# Patient Record
Sex: Female | Born: 1955
Health system: Southern US, Community
[De-identification: ages and names within clinical notes are randomized; demographics above are authoritative.]

## PROBLEM LIST (undated history)

## (undated) DIAGNOSIS — R011 Cardiac murmur, unspecified: Secondary | ICD-10-CM

## (undated) DIAGNOSIS — H269 Unspecified cataract: Secondary | ICD-10-CM

## (undated) DIAGNOSIS — H409 Unspecified glaucoma: Secondary | ICD-10-CM

## (undated) DIAGNOSIS — R7303 Prediabetes: Secondary | ICD-10-CM

## (undated) DIAGNOSIS — E669 Obesity, unspecified: Secondary | ICD-10-CM

## (undated) DIAGNOSIS — E781 Pure hyperglyceridemia: Secondary | ICD-10-CM

## (undated) HISTORY — DX: Obesity, unspecified: E66.9

## (undated) HISTORY — DX: Cardiac murmur, unspecified: R01.1

## (undated) HISTORY — DX: Unspecified glaucoma: H40.9

## (undated) HISTORY — DX: Unspecified cataract: H26.9

## (undated) HISTORY — DX: Pure hyperglyceridemia: E78.1

## (undated) HISTORY — DX: Prediabetes: R73.03

## (undated) HISTORY — PX: GLAUCOMA SURGERY: SHX656

---

## 2012-06-30 ENCOUNTER — Ambulatory Visit (INDEPENDENT_AMBULATORY_CARE_PROVIDER_SITE_OTHER): Payer: BC Managed Care – PPO | Admitting: Family Medicine

## 2012-06-30 ENCOUNTER — Encounter: Payer: Self-pay | Admitting: Family Medicine

## 2012-06-30 VITALS — BP 124/80 | HR 50 | Ht 65.0 in | Wt 162.0 lb

## 2012-06-30 DIAGNOSIS — R7309 Other abnormal glucose: Secondary | ICD-10-CM

## 2012-06-30 DIAGNOSIS — R7303 Prediabetes: Secondary | ICD-10-CM

## 2012-06-30 DIAGNOSIS — Z0000 Encounter for general adult medical examination without abnormal findings: Secondary | ICD-10-CM

## 2012-06-30 HISTORY — DX: Prediabetes: R73.03

## 2012-06-30 NOTE — Patient Instructions (Signed)
Diabetes y Doroteo Glassman fsica (Diabetes and Exercise) La actividad fsica regular es muy importante y [place] a:   Chief Operating Officer el nivel de glucosa en sangre (azcar).   Disminuir la presin arterial.   Controlar el colesterol en sangre (colesterol y triglicridos).   Mejorar el estado de salud general.  BENEFICIOS DE LA ACTIVIDAD FSICA  Mejora el buen estado fsico.   Mejora la flexibilidad.   Aumenta la resistencia.   Aumenta la densidad sea.   Favorece el control del peso.   Aumenta la fuerza muscular.   Disminuye la Art gallery manager.   Mejora la utilizacin de la insulina por parte del organismo.   Aumenta la sensibilidad a la insulina.   Reduce las necesidades de insulina.   Har que se sienta mejor.   Reduce el estrs y las tensiones.  Las personas diabticas que incorporan la actividad fsica a su estilo de vida obtienen beneficios adicionales.  Prdida de peso.   Reduccin del apetito.   Mejora la utilizacin de la glucosa por parte del organismo.   Disminuye los factores de riesgo para las enfermedades cardacas.   Disminuye el colesterol y los triglicridos.   Eleva el nivel de colesterol bueno (lipoproteinas de alta densidad HDL).   Disminuye el nivel de Banker.   Disminuye la presin arterial.  DIABETES TIPO I Y ACTIVIDAD FSICA  La actividad fsica disminuir el nivel de glucosa en [place].   Si el nivel de glucosa en sangre es de ms de 240 mg/dl, controle las cetonas en la [place]. Si hay cetonas, no realice actividad fisica.   El sitio de inyeccin de la insulina puede requerir un ajuste cuando se realiza actividad fsica. Evite inyectarse insulina en las zonas del cuerpo que ejercitar. Por ejemplo, evite inyectarse insulina en:   Los brazos, si juega al tenis.   Las piernas, si corre. Para obtener ms informacion, consulte a su mdico.   Lleve un registro de:   La ingesta de alimentos.   El tipo y cantidad de [place].     Los momentos esperables de picos de accin de la insulina.   Los niveles de Event organiser.  Hgalo antes, durante y despus de Printmaker actividad fsica. Verifique los registros junto con su mdico. Esto ser de utilidad para la confeccin de pautas para ajustar la ingesta de alimentos o las cantidades de [place].  DIABETES TIPO 2 Y ACTIVIDAD FSICA  La actividad fsica regular ayuda a controlar el nivel de glucosa en [place].   La actividad fsica es importante porque:   Aumenta la sensibilidad del organismo a la insulina.   Mejora el control del nivel de Event organiser.   Reduce el riesgo de enfermedades cardiacas. Disminuye el colesterol srico y los triglicridos. Disminuye la presin arterial.   Las personas que reciben insulina o agentes hipoglucemiantes por va oral deben controlar la aparicin de signos de hipoglucemia (mareos, temblores, sudoracin, escalofros y confusin).   Durante la actividad fsica se pierde Data processing manager. Esta prdida de lquidos debe reponerse. De este modo se evita la prdida de lquidos corporales (deshidratacin) y el golpe de Airline pilot.  Comente con su mdico antes de comenzar un programa de actividad fsica para verificar que sea seguro para usted. Recuerde, cualquier actividad es mejor que ninguna.  Document Released: 11/02/2007 Document Revised: 10/02/2011 [facility] Patient Information 2012 [place], [place].

## 2012-06-30 NOTE — Progress Notes (Signed)
CC: Caroline Silva is a 56 y.o. female is here for Establish Care   Subjective: HPI:  Patient is a very pleasant 56 year old who states primarily Spanish however can converse easily in [place] and is here to establish care. She requests a complete physical exam but due to time constraints she would like to discuss the following death  She was told at a health fair that her sugar was elevated she believes that her value was 94 she's unsure whether or not she was fasting or not. 2 of her brothers have type 2 diabetes but there are no other chronic medical conditions that run in her family. She denies polyphagia polydipsia polyuria, poorly healing wounds, foot complaints, motor sensory disturbances, chest pain, nor shortness of breath. She takes no medications on a daily basis a multivitamin. She has no past surgical history in states she has no past medical history. She works as a Press photographer on a Restaurant manager, fast food Outlined In HPI  History reviewed. No pertinent past medical history.   History reviewed. No pertinent family history.   History  Substance Use Topics  . Smoking status: Former Games developer  . Smokeless tobacco: Not on file  . Alcohol Use: No     Objective: Filed Vitals:   06/30/12 1543  BP: 124/80  Pulse: 50    General: Alert and Oriented, No Acute Distress HEENT: Pupils equal, round, reactive to light. Conjunctivae clear.  External ears unremarkable, canals clear with intact TMs with appropriate landmarks.  Middle ear appears open without effusion. Pink inferior turbinates.  Moist mucous membranes, pharynx without inflammation nor lesions.  Neck supple without palpable lymphadenopathy nor abnormal masses. Lungs: Clear to auscultation bilaterally, no wheezing/ronchi/rales.  Comfortable work of breathing. Good air movement. Cardiac: Regular rate and rhythm. Normal S1/S2.  No murmurs, rubs, nor gallops.  No carotid bruits Extremities: No peripheral  edema.  Strong peripheral pulses. Bilateral feet without calluses or skin breakdown. Mental Status: No depression, anxiety, nor agitation. Skin: Warm and dry.  Assessment & Plan: Caroline Silva was seen today for establish care.  Diagnoses and associated orders for this visit:  Abnormal glucose - POCT HgB A1C  Routine health maintenance - MM Digital Screening; Future - Lipid panel  Prediabetes  Her hemoglobin A1c of 6.0 put her in a prediabetic range, this diagnosis was discussed with her and handout in Spanish was given to her outlining dietary and physical activity interventions to help minimize her risk of developing diabetes. In 3 months we'll check another A1c. She states it has been years since her cholesterol checked and she's fasting right now so we are lipid panel. She sits for more than a year since she's had a mammogram therefore a referral was placed today. She tells me she has no history of abnormal Pap smears her most recent one was 3 years ago, she would like to wait until the five-year mark before having another. I've asked her to return for complete physical exam or in 3 months at the latest for followup of her sugar levels he'll call her with her cholesterol results tomorrow.    Return in about 3 months (around 09/29/2012).  Requested Prescriptions    No prescriptions requested or ordered in this encounter

## 2012-07-01 LAB — LIPID PANEL
HDL: 45 mg/dL (ref >39)
LDL Cholesterol: 123 mg/dL — ABNORMAL HIGH (ref 0–99)
Total CHOL/HDL Ratio: 4.3 Ratio
Triglycerides: 129 mg/dL (ref <150)
VLDL: 26 mg/dL (ref 0–40)

## 2012-07-06 ENCOUNTER — Ambulatory Visit (INDEPENDENT_AMBULATORY_CARE_PROVIDER_SITE_OTHER): Payer: BC Managed Care – PPO

## 2012-07-06 DIAGNOSIS — Z1231 Encounter for screening mammogram for malignant neoplasm of breast: Secondary | ICD-10-CM

## 2012-07-06 DIAGNOSIS — Z0000 Encounter for general adult medical examination without abnormal findings: Secondary | ICD-10-CM

## 2013-09-20 ENCOUNTER — Ambulatory Visit: Payer: BC Managed Care – PPO | Admitting: Family Medicine

## 2014-01-20 ENCOUNTER — Ambulatory Visit (INDEPENDENT_AMBULATORY_CARE_PROVIDER_SITE_OTHER): Payer: BC Managed Care – PPO | Admitting: Family Medicine

## 2014-01-20 ENCOUNTER — Other Ambulatory Visit (HOSPITAL_COMMUNITY)
Admission: RE | Admit: 2014-01-20 | Discharge: 2014-01-20 | Disposition: A | Discharge status: Home / Self Care | Payer: BC Managed Care – PPO | Source: Ambulatory Visit | Attending: Family Medicine | Admitting: Family Medicine

## 2014-01-20 ENCOUNTER — Encounter: Payer: Self-pay | Admitting: Family Medicine

## 2014-01-20 VITALS — BP 110/67 | HR 54 | Ht 65.0 in | Wt 174.0 lb

## 2014-01-20 DIAGNOSIS — Z124 Encounter for screening for malignant neoplasm of cervix: Secondary | ICD-10-CM

## 2014-01-20 DIAGNOSIS — R87810 Cervical high risk human papillomavirus (HPV) DNA test positive: Secondary | ICD-10-CM | POA: Insufficient documentation

## 2014-01-20 DIAGNOSIS — Z1239 Encounter for other screening for malignant neoplasm of breast: Secondary | ICD-10-CM

## 2014-01-20 DIAGNOSIS — R7303 Prediabetes: Secondary | ICD-10-CM

## 2014-01-20 DIAGNOSIS — R7309 Other abnormal glucose: Secondary | ICD-10-CM

## 2014-01-20 DIAGNOSIS — Z1151 Encounter for screening for human papillomavirus (HPV): Secondary | ICD-10-CM | POA: Insufficient documentation

## 2014-01-20 NOTE — Progress Notes (Signed)
CC: Caroline Silva is a 58 y.o. female is here for pap only and mammogram   Subjective: HPI:  No acute complaints, she recently had her blood sugar checked at work during a physical exam and had prediabetic range of blood sugar. She is uncertain if this was an A1c or fasting blood sugar. Denies polyuria polyphasia or polydipsia  She is requesting a Pap smear the last one was over 5 years ago without any abnormals. She denies any vaginal bleeding since menopause estimated at 10 years ago.  She's due for her annual mammogram   Review Of Systems Outlined In HPI  No past medical history on file.  No past surgical history on file. No family history on file.  History   Social History  . Marital Status: Single    Spouse Name: N/A    Number of Children: N/A  . Years of Education: N/A   Occupational History  . Not on file.   Social History Main Topics  . Smoking status: Former Research scientist (life sciences)  . Smokeless tobacco: Not on file  . Alcohol Use: No  . Drug Use: No  . Sexual Activity: Not on file   Other Topics Concern  . Not on file   Social History Narrative  . No narrative on file     Objective: BP 110/67  Pulse 54  Ht 5\' 5"  (1.651 m)  Wt 174 lb (78.926 kg)  BMI 28.96 kg/m2   Pulmonary: Clear to auscultation bilaterally without wheezing, rhonchi, nor rales. Cardiac: Regular rate and rhythm.  No murmurs, rubs, nor gallops. No peripheral edema.  2+ peripheral pulses bilaterally. Abdomen: Bowel sounds normal.  No masses.  Non-tender without rebound.  Negative Murphy's sign. GU: Labia majora & minora without lesions.  Distal urethra unremarkable.  Vaginal wall integrity preserved without mucosal lesions.  Cervix non-tender and without gross lesions nor discharge.     Assessment & Plan: Caroline Silva was seen today for pap only and mammogram.  Diagnoses and associated orders for this visit:  Screening for cervical cancer - Cytology - PAP  Screening for breast cancer - MM DIGITAL  SCREENING BILATERAL; Future  Prediabetes    Pap smear was obtained without difficulty will notify of results once available Mammogram has been ordered Prediabetes appears to be stable with her report of blood sugar at work   Return in about 3 months (around 04/22/2014).

## 2014-01-24 ENCOUNTER — Ambulatory Visit (INDEPENDENT_AMBULATORY_CARE_PROVIDER_SITE_OTHER): Payer: BC Managed Care – PPO

## 2014-01-24 DIAGNOSIS — Z1231 Encounter for screening mammogram for malignant neoplasm of breast: Secondary | ICD-10-CM

## 2014-01-24 DIAGNOSIS — Z1239 Encounter for other screening for malignant neoplasm of breast: Secondary | ICD-10-CM

## 2014-01-26 ENCOUNTER — Encounter: Payer: Self-pay | Admitting: Family Medicine

## 2014-01-26 DIAGNOSIS — IMO0002 Reserved for concepts with insufficient information to code with codable children: Secondary | ICD-10-CM | POA: Insufficient documentation

## 2014-07-20 ENCOUNTER — Ambulatory Visit (INDEPENDENT_AMBULATORY_CARE_PROVIDER_SITE_OTHER): Payer: BC Managed Care – PPO | Admitting: Family Medicine

## 2014-07-20 ENCOUNTER — Encounter: Payer: Self-pay | Admitting: Family Medicine

## 2014-07-20 VITALS — BP 107/68 | HR 48 | Wt 184.0 lb

## 2014-07-20 DIAGNOSIS — M25519 Pain in unspecified shoulder: Secondary | ICD-10-CM

## 2014-07-20 DIAGNOSIS — B351 Tinea unguium: Secondary | ICD-10-CM

## 2014-07-20 DIAGNOSIS — M25512 Pain in left shoulder: Secondary | ICD-10-CM

## 2014-07-20 MED ORDER — TERBINAFINE HCL 250 MG PO TABS
250.0000 mg | ORAL_TABLET | Freq: Every day | ORAL | Status: DC
Start: 2014-07-20 — End: 2015-06-29

## 2014-07-20 NOTE — Progress Notes (Signed)
CC: Caroline Silva is a 58 y.o. female is here for left shoulder pain   Subjective: HPI:  Complains of left shoulder pain localized in the lateral aspect of the shoulder which is nonradiating. It is worse when lying on the left shoulder or with any abduction of the arm greater than about 75. Symptoms have been present for about 2-3 months but significantly worsened over the last 4 weeks. 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 No interventions as of yet. Denies weakness or any motor or sensory disturbances in the left upper extremity. Denies any recent or trauma and denies neck pain. There's been no overlying skin changes or swelling at the site of discomfort  Complains of thickening of the left great toenail that has been present for the past year. It is becoming discolored and painful to touch. Symptoms have been present on a daily basis and nothing particularly makes better or worse no interventions as of yet. Denies skin changes elsewhere   Review Of Systems Outlined In HPI  Past Medical History  Diagnosis Date  . Prediabetes 06/30/2012    No past surgical history on file. No family history on file.  History   Social History  . Marital Status: Single    Spouse Name: N/A    Number of Children: N/A  . Years of Education: N/A   Occupational History  . Not on file.   Social History Main Topics  . Smoking status: Former Research scientist (life sciences)  . Smokeless tobacco: Not on file  . Alcohol Use: No  . Drug Use: No  . Sexual Activity: Not on file   Other Topics Concern  . Not on file   Social History Narrative  . No narrative on file     Objective: BP 107/68  Pulse 48  Wt 184 lb (83.462 kg)  General: Alert and Oriented, No Acute Distress HEENT: Pupils equal, round, reactive to light. Conjunctivae clear.  Moist membranes Lungs: Clear to auscultation bilaterally, no wheezing/ronchi/rales.  Comfortable work of breathing. Good air movement. Cardiac: Regular rate and rhythm. Normal S1/S2.   No murmurs, rubs, nor gallops.   Left shoulder exam reveals full range of motion and strength in all planes of motion and with individual rotator cuff testing. No overlying redness warmth or swelling.  Neer's test positive.  Hawkins test negative. Empty can negative. Crossarm test positive. O'Brien's test negative. Apprehension test negative. Speed's test negative. Extremities: No peripheral edema.  Strong peripheral pulses.  Mental Status: No depression, anxiety, nor agitation. Skin: Warm and dry. Moderate onychomycosis on the majority of the left foot toenails  Assessment & Plan: Caroline Silva was seen today for left shoulder pain.  Diagnoses and associated orders for this visit:  Onychomycosis - terbinafine (LAMISIL) 250 MG tablet; Take 1 tablet (250 mg total) by mouth daily.  Left shoulder pain    onychomycosis: Start terbinafine, discussed that we'll take 1 or 2 months before she starts to notice any improvement and that the disease female will need to grow out until it is able to be cut Left shoulder pain: Suspicious of supraspinatus tendinitis therefore she was offered a regimen of outpatient anti-inflammatories or a subacromial injection and she would prefer the injection today  Subacromial Shoulder Injection Procedure Note  Pre-operative Diagnosis: left RTC tendintis  Post-operative Diagnosis: same  Indications: pain  Anesthesia: topical cold spray  Procedure Details   Verbal consent was obtained for the procedure. The shoulder was prepped with alcohol skin was anesthetized. A 27 gauge needle was  advanced into the subacromial space through posterior approach without difficulty  The space was then injected with 2 ml 1% lidocaine and 2 ml of triamcinolone (KENALOG) 40mg /ml. The injection site was cleansed with isopropyl alcohol and a dressing was applied.  Complications:  None; patient tolerated the procedure well.   Return if symptoms worsen or fail to improve.

## 2015-06-29 ENCOUNTER — Encounter: Payer: Self-pay | Admitting: Family Medicine

## 2015-06-29 ENCOUNTER — Ambulatory Visit (INDEPENDENT_AMBULATORY_CARE_PROVIDER_SITE_OTHER): Payer: BLUE CROSS/BLUE SHIELD | Admitting: Family Medicine

## 2015-06-29 VITALS — BP 108/66 | HR 61 | Wt 187.0 lb

## 2015-06-29 DIAGNOSIS — M25512 Pain in left shoulder: Secondary | ICD-10-CM | POA: Diagnosis not present

## 2015-06-29 NOTE — Progress Notes (Signed)
CC: Caroline Silva is a 59 y.o. female is here for left arm pain   Subjective: HPI:  Left shoulder pain: Describes an achiness and pain mild to moderate in severity localized deep in the left shoulder. It radiates slightly down the lateral side of the proximal arm. It goes no further than the elbow. It's worse with any abduction or reaching above her head. It's identical to the pain that she experienced last year around this time. 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 She denies any swelling redness or warmth of the shoulder. She denies any recent remote trauma. She denies joint pain elsewhere. She denies any weakness motor or sensory disturbances in the left upper extremity. She denies neck pain.   Review Of Systems Outlined In HPI  Past Medical History  Diagnosis Date  . Prediabetes 06/30/2012    No past surgical history on file. No family history on file.  Social History   Social History  . Marital Status: Single    Spouse Name: N/A  . Number of Children: N/A  . Years of Education: N/A   Occupational History  . Not on file.   Social History Main Topics  . Smoking status: Former Research scientist (life sciences)  . Smokeless tobacco: Not on file  . Alcohol Use: No  . Drug Use: No  . Sexual Activity: Not on file   Other Topics Concern  . Not on file   Social History Narrative     Objective: BP 108/66 mmHg  Pulse 61  Wt 187 lb (84.823 kg)  Vital signs reviewed. General: Alert and Oriented, No Acute Distress HEENT: Pupils equal, round, reactive to light. Conjunctivae clear.  External ears unremarkable.  Moist mucous membranes. Lungs: Clear and comfortable work of breathing, speaking in full sentences without accessory muscle use. Cardiac: Regular rate and rhythm.  Neuro: CN II-XII grossly intact, gait normal. Right shoulder exam reveals full range of motion and strength in  all planes of motion and with individual rotator cuff testing. No overlying redness warmth or swelling.  Neer's test negative.  Hawkins test positive. Empty can positive. Crossarm test negative. O'Brien's test negative. Apprehension test negative. Speed's test negative. Extremities: No peripheral edema.  Strong peripheral pulses.  Mental Status: No depression, anxiety, nor agitation. Logical though process. Skin: Warm and dry.  Assessment & Plan: Nakeitha was seen today for left arm pain.  Diagnoses and all orders for this visit:  Left shoulder pain   Left shoulder pain still sounds like bursitis or rotator cuff tendinitis, I've offered her oral medication or a repeat injection similar to last year. She would prefer to have the injection done given how well it worked last year.  Subacromial Shoulder Injection Procedure Note  Pre-operative Diagnosis: left shoulder pain  Post-operative Diagnosis: same  Indications: left shoulder pain  Anesthesia: topical cold spray  Procedure Details   Verbal consent was obtained for the procedure. The shoulder was prepped with alcohol and the skin was anesthetized. A 27 gauge needle was advanced into the subacromial space through posterior approach without difficulty  The space was then injected with 2 ml 1% lidocaine and 2 ml of triamcinolone (KENALOG) 40mg /ml. The injection site was cleansed with isopropyl alcohol and a dressing was applied.  Complications:  None; patient tolerated the procedure well.   Return if symptoms worsen or fail to improve.

## 2015-11-06 ENCOUNTER — Encounter: Payer: Self-pay | Admitting: Family Medicine

## 2015-11-06 ENCOUNTER — Ambulatory Visit (INDEPENDENT_AMBULATORY_CARE_PROVIDER_SITE_OTHER): Payer: BLUE CROSS/BLUE SHIELD | Admitting: Sports Medicine

## 2015-11-06 ENCOUNTER — Ambulatory Visit (INDEPENDENT_AMBULATORY_CARE_PROVIDER_SITE_OTHER): Payer: BLUE CROSS/BLUE SHIELD | Admitting: Family Medicine

## 2015-11-06 ENCOUNTER — Ambulatory Visit (INDEPENDENT_AMBULATORY_CARE_PROVIDER_SITE_OTHER): Payer: BLUE CROSS/BLUE SHIELD

## 2015-11-06 VITALS — BP 111/67 | HR 57 | Wt 181.0 lb

## 2015-11-06 DIAGNOSIS — M25512 Pain in left shoulder: Secondary | ICD-10-CM

## 2015-11-06 NOTE — Progress Notes (Signed)
   Subjective:    I'm seeing this patient as a consultation for:  Dr. Ileene Rubens  CC: Left shoulder pain  HPI: This is a pleasant 60 year old female, for several months she's had pain that she localizes over the deltoid of her left shoulder, worse with most motions but not over head motions. She had a subacromial injection that provided mild to moderate improvement, but unfortunately continues to have pain. No mechanical symptoms, no trauma.  Past medical history, Surgical history, Family history not pertinant except as noted below, Social history, Allergies, and medications have been entered into the medical record, reviewed, and no changes needed.   Review of Systems: No headache, visual changes, nausea, vomiting, diarrhea, constipation, dizziness, abdominal pain, skin rash, fevers, chills, night sweats, weight loss, swollen lymph nodes, body aches, joint swelling, muscle aches, chest pain, shortness of breath, mood changes, visual or auditory hallucinations.   Objective:   General: Well Developed, well nourished, and in no acute distress.  Neuro/Psych: Alert and oriented x3, extra-ocular muscles intact, able to move all 4 extremities, sensation grossly intact. Skin: Warm and dry, no rashes noted.  Respiratory: Not using accessory muscles, speaking in full sentences, trachea midline.  Cardiovascular: Pulses palpable, no extremity edema. Abdomen: Does not appear distended. Left Shoulder: Inspection reveals no abnormalities, atrophy or asymmetry. Palpation is normal with no tenderness over AC joint or bicipital groove. ROM is full in all planes. Rotator cuff strength normal throughout. No signs of impingement with negative Neer and Hawkin's tests, empty can. Speeds and Yergason's tests normal. Glenohumeral pathology noted with a positive Obrien's, positive crank, negative clunk, and good stability. Normal scapular function observed. No painful arc and no drop arm sign. No apprehension  sign  Procedure: Real-time Ultrasound Guided Injection of left glenohumeral joint Device: GE Logiq E  Verbal informed consent obtained.  Time-out conducted.  Noted no overlying erythema, induration, or other signs of local infection.  Skin prepped in a sterile fashion.  Local anesthesia: Topical Ethyl chloride.  With sterile technique and under real time ultrasound guidance:  22-gauge spinal needle advanced into the humeral joint taking care to avoid the labrum, 1 mL kenalog 40, 2 mL lidocaine, 2 mL Marcaine injected easily. Completed without difficulty  Pain immediately resolved suggesting accurate placement of the medication.  Advised to call if fevers/chills, erythema, induration, drainage, or persistent bleeding.  Images permanently stored and available for review in the ultrasound unit.  Impression: Technically successful ultrasound guided injection.  Impression and Recommendations:   This case required medical decision making of moderate complexity.

## 2015-11-06 NOTE — Assessment & Plan Note (Signed)
Pain seems to be referable to day to the glenohumeral joint, glenohumeral injection as above, formal physical therapy, return to see me in one month.

## 2015-11-06 NOTE — Progress Notes (Signed)
CC: Caroline Silva is a 60 y.o. female is here for Shoulder Pain   Subjective: HPI:   left shoulder pain since at least last September. She received a subacromial steroid injection by myself which provided 50% relief of pain however pain is slowly returning. Hurts to lift arm above her head or put on a jacket. If she rolls over her left shoulder in her sleep it will wake her up. No benefit from over-the-counter anti-inflammatories. She denies any radiation of her pain is located deep in the shoulder. She denies any swelling redness or warmth of the shoulder. There's been no shortness of breath wheezing or chest discomfort.  Review Of Systems Outlined In HPI  Past Medical History  Diagnosis Date  . Prediabetes 06/30/2012    No past surgical history on file. No family history on file.  Social History   Social History  . Marital Status: Single    Spouse Name: N/A  . Number of Children: N/A  . Years of Education: N/A   Occupational History  . Not on file.   Social History Main Topics  . Smoking status: Former Research scientist (life sciences)  . Smokeless tobacco: Not on file  . Alcohol Use: No  . Drug Use: No  . Sexual Activity: Not on file   Other Topics Concern  . Not on file   Social History Narrative     Objective: BP 111/67 mmHg  Pulse 57  Wt 181 lb (82.101 kg)  General: Alert and Oriented, No Acute Distress HEENT: Pupils equal, round, reactive to light. Conjunctivae clear.  Moist mucous membranes Lungs: Clear to auscultation bilaterally, no wheezing/ronchi/rales.  Comfortable work of breathing. Good air movement. Cardiac: Regular rate and rhythm. Normal S1/S2.  No murmurs, rubs, nor gallops.   Left shoulder exam reveals full range of motion and strength in all planes of motion and with individual rotator cuff testing. No overlying redness warmth or swelling.  Neer's test negative.  Hawkins test positive. Empty can negative. Crossarm test negative. O'Brien's test negative. Apprehension test  negative. Speed's test negative. Extremities: No peripheral edema.  Strong peripheral pulses.  Mental Status: No depression, anxiety, nor agitation. Skin: Warm and dry.  Assessment & Plan: Mischell was seen today for shoulder pain.  Diagnoses and all orders for this visit:  Left shoulder pain -     DG Shoulder Left; Future   X-ray was obtained showing a personal interpretation of only trace to mild glenohumeral osteoarthritis. Referred her to Dr. Darene Lamer in sports medicine today for further evaluation.  No Follow-up on file.

## 2016-02-19 ENCOUNTER — Telehealth: Payer: Self-pay | Admitting: Family Medicine

## 2016-02-19 MED ORDER — TERBINAFINE HCL 250 MG PO TABS: 250.0000 mg | ORAL_TABLET | Freq: Every day | ORAL | Status: DC

## 2016-02-19 NOTE — Telephone Encounter (Signed)
Patient is requesting for you to send over a rx for Lamisil for her toenail.  She uses [facility] [address] here in [place]. thanks

## 2016-02-19 NOTE — Telephone Encounter (Signed)
Rx sent 

## 2016-02-20 DIAGNOSIS — Z961 Presence of intraocular lens: Secondary | ICD-10-CM | POA: Diagnosis not present

## 2016-02-20 DIAGNOSIS — H2511 Age-related nuclear cataract, right eye: Secondary | ICD-10-CM | POA: Diagnosis not present

## 2016-02-20 DIAGNOSIS — H401133 Primary open-angle glaucoma, bilateral, severe stage: Secondary | ICD-10-CM | POA: Diagnosis not present

## 2016-02-20 NOTE — Telephone Encounter (Signed)
Pt.notified

## 2016-05-08 DIAGNOSIS — K5900 Constipation, unspecified: Secondary | ICD-10-CM | POA: Diagnosis not present

## 2016-05-08 DIAGNOSIS — R3915 Urgency of urination: Secondary | ICD-10-CM | POA: Diagnosis not present

## 2016-05-08 DIAGNOSIS — N3090 Cystitis, unspecified without hematuria: Secondary | ICD-10-CM | POA: Diagnosis not present

## 2016-05-16 ENCOUNTER — Encounter: Payer: Self-pay | Admitting: Family Medicine

## 2016-05-16 ENCOUNTER — Ambulatory Visit (INDEPENDENT_AMBULATORY_CARE_PROVIDER_SITE_OTHER): Payer: BLUE CROSS/BLUE SHIELD | Admitting: Family Medicine

## 2016-05-16 VITALS — BP 107/69 | HR 48 | Temp 98.2°F | Wt 184.0 lb

## 2016-05-16 DIAGNOSIS — R350 Frequency of micturition: Secondary | ICD-10-CM | POA: Diagnosis not present

## 2016-05-16 DIAGNOSIS — R42 Dizziness and giddiness: Secondary | ICD-10-CM

## 2016-05-16 LAB — CBC
HEMATOCRIT: 40.6 % (ref 35.0–45.0)
HEMOGLOBIN: 13.3 g/dL (ref 11.7–15.5)
MCH: 30.7 pg (ref 27.0–33.0)
MCHC: 32.8 g/dL (ref 32.0–36.0)
MCV: 93.8 fL (ref 80.0–100.0)
MPV: 10.2 fL (ref 7.5–12.5)
Platelets: 251 10*3/uL (ref 140–400)
RBC: 4.33 MIL/uL (ref 3.80–5.10)
RDW: 13 % (ref 11.0–15.0)
WBC: 4.9 10*3/uL (ref 3.8–10.8)

## 2016-05-16 LAB — LIPID PANEL
CHOL/HDL RATIO: 5.5 ratio — AB (ref <=5.0)
Cholesterol: 208 mg/dL — ABNORMAL HIGH (ref 125–200)
HDL: 38 mg/dL — ABNORMAL LOW (ref >=46)
LDL CALC: 137 mg/dL — AB (ref <130)
Triglycerides: 165 mg/dL — ABNORMAL HIGH (ref <150)
VLDL: 33 mg/dL — ABNORMAL HIGH (ref <30)

## 2016-05-16 MED ORDER — SULFAMETHOXAZOLE-TRIMETHOPRIM 800-160 MG PO TABS: 1.0000 | ORAL_TABLET | Freq: Two times a day (BID) | ORAL | Status: DC

## 2016-05-16 NOTE — Progress Notes (Signed)
CC: Caroline Silva is a 60 y.o. female is here for Dizziness   Subjective: HPI:  For the past 2 weeks she's had urinary urgency that's been worsening on weekly basis. She was seen at her employee health and was prescribed nitrofurantoin however she does not feel like is helping. She is waking up 3 times a night to urinate which is abnormal for her. She tells me burns internally in the pelvis when she urinates and also just external to the urethra. No other interventions as of yet. She denies any fevers, chills or abdominal pain. What prompted today's visit was that she was at work and had a slow onset of lightheadedness, tunnel vision, and coworkers told her that she looked pale in the face. It resolved with sitting down. She denies any chest pain, irregular heartbeat, gastrointestinal complaints, cognitive complaints, and tells me that these initial symptoms have all resolved other than the urinary symptoms. She denies any genitourinary complaints other than that above. She denies any blood in her urine or vaginal discharge or bleeding.   Review Of Systems Outlined In HPI  Past Medical History  Diagnosis Date  . Prediabetes 06/30/2012    No past surgical history on file. No family history on file.  Social History   Social History  . Marital Status: Single    Spouse Name: N/A  . Number of Children: N/A  . Years of Education: N/A   Occupational History  . Not on file.   Social History Main Topics  . Smoking status: Former Research scientist (life sciences)  . Smokeless tobacco: Not on file  . Alcohol Use: No  . Drug Use: No  . Sexual Activity: Not on file   Other Topics Concern  . Not on file   Social History Narrative     Objective: BP 107/69 mmHg  Pulse 48  Temp(Src) 98.2 F (36.8 C) (Oral)  Wt 184 lb (83.462 kg)  General: Alert and Oriented, No Acute Distress HEENT: Pupils equal, round, reactive to light. Conjunctivae clear.  Moist mucous membranes Lungs: Clear to auscultation bilaterally, no  wheezing/ronchi/rales.  Comfortable work of breathing. Good air movement. Cardiac: Regular rate and rhythm. Normal S1/S2.  No murmurs, rubs, nor gallops.   Cranial nerves II through XII grossly intact Extremities: No peripheral edema.  Strong peripheral pulses.  Mental Status: No depression, anxiety, nor agitation. Skin: Warm and dry.  Assessment & Plan: Shaquille was seen today for dizziness.  Diagnoses and all orders for this visit:  Urinary frequency -     Urine culture -     CBC -     Lipid panel  Dizzy -     Urine culture -     CBC -     Lipid panel  Other orders -     sulfamethoxazole-trimethoprim (BACTRIM DS,SEPTRA DS) 800-160 MG tablet; Take 1 tablet by mouth 2 (two) times daily.   Stopping Macrobid and switching to Bactrim pending culture over the weekend. Dizziness is most likely due to relative hypotension or anemia., she hasn't had anything to eat since late last night I like to get a CBC. She does note her blood sugar was checked within the last few weeks at work and was normal. She tells her in the past her cholesterol has been bad and she would like to have this checked since she is getting blood work anyway this seems reasonable.Signs and symptoms requring emergent/urgent reevaluation were discussed with the patient. Ultimate plan will be based on the above results  Return  if symptoms worsen or fail to improve.

## 2016-05-17 LAB — URINE CULTURE: ORGANISM ID, BACTERIA: NO GROWTH

## 2016-05-20 ENCOUNTER — Other Ambulatory Visit: Payer: Self-pay | Admitting: Family Medicine

## 2016-06-25 DIAGNOSIS — H401133 Primary open-angle glaucoma, bilateral, severe stage: Secondary | ICD-10-CM | POA: Diagnosis not present

## 2016-08-07 DIAGNOSIS — H401133 Primary open-angle glaucoma, bilateral, severe stage: Secondary | ICD-10-CM | POA: Diagnosis not present

## 2016-09-10 ENCOUNTER — Encounter: Payer: Self-pay | Admitting: Physician Assistant

## 2016-09-10 ENCOUNTER — Ambulatory Visit (INDEPENDENT_AMBULATORY_CARE_PROVIDER_SITE_OTHER): Payer: BLUE CROSS/BLUE SHIELD | Admitting: Physician Assistant

## 2016-09-10 VITALS — BP 120/57 | HR 58 | Ht 65.0 in | Wt 184.0 lb

## 2016-09-10 DIAGNOSIS — K2100 Gastro-esophageal reflux disease with esophagitis, without bleeding: Secondary | ICD-10-CM

## 2016-09-10 MED ORDER — OMEPRAZOLE 40 MG PO CPDR: 40.0000 mg | DELAYED_RELEASE_CAPSULE | Freq: Every day | ORAL | 2 refills | Status: DC

## 2016-09-10 NOTE — Patient Instructions (Signed)
Opciones de alimentos para pacientes con reflujo gastroesofgico - Adultos (Food Choices for Gastroesophageal Reflux Disease, Adult) Cuando se tiene reflujo gastroesofgico (ERGE), los alimentos que se ingieren y los hbitos de alimentacin son muy importantes. Elegir los alimentos adecuados puede ayudar a [employer]. [place]?  Elija las frutas, los vegetales, los cereales integrales y los productos lcteos con bajo contenido de [place].  [place], de pescado y de ave con bajo contenido de grasas.  Limite las grasas, como los [place], los aderezos para [place], la [place], los frutos secos y Publishing copy.  Lleve un registro de alimentos. Esto ayuda a identificar los alimentos que ocasionan sntomas.  Evite los alimentos que le ocasionen sntomas. Pueden ser distintos para cada persona.  Haga comidas pequeas durante Psychiatrist de 3 comidas abundantes.  Coma lentamente, en un lugar donde est distendido.  Limite el consumo de alimentos fritos.  Cocine los alimentos utilizando mtodos que no sean la fritura.  Evite el consumo alcohol.  Evite beber grandes cantidades de lquidos con las comidas.  Evite agacharse o recostarse hasta despus de 2 o 3horas de haber comido. QU ALIMENTOS NO SE RECOMIENDAN? Estos son algunos alimentos y bebidas que pueden empeorar los sntomas: Actor. Jugo de tomate. Salsa de tomate y espagueti. Ajes. [place] y [place]. Rbano picante. Frutas  Naranjas, pomelos y limn (fruta y [place]). Carnes  Carnes de [place], de pescado y de ave con gran contenido de grasas. Esto incluye los perros calientes, las [place], el [place], la salchicha, el salame y el tocino. Lcteos  Leche entera y [place]. [employer]. Crema. [place]. Helados. Queso crema. Bebidas  T o caf. Bebidas gaseosas o bebidas energizantes. Condimentos  Salsa picante. Salsa barbacoa. Dulces/postres  Chocolate y cacao.  [place]. Menta y mentol. Grasas y [employer]. Esto incluye las papas fritas. Otros  [place]. Especias picantes. Esto incluye la pimienta negra, la pimienta blanca, la pimienta roja, la pimienta de cayena, el curry en polvo, los clavos de [place], el jengibre y el [place] en polvo. Esta no es [employer] de los alimentos y las bebidas que se Higher education careers adviser. Comunquese con el nutricionista para recibir ms informacin.  Esta informacin no tiene Marine scientist el consejo del mdico. Asegrese de hacerle al mdico cualquier pregunta que tenga. Document Released: 04/13/2012 Document Revised: 11/03/2014 Document Reviewed: 08/17/2013 Elsevier Interactive Patient Education  2017 [place] estomacal (Heartburn) La acidez estomacal es un tipo de dolor o de molestia que se puede presentar en la garganta o en el pecho. A menudo se la describe como Designer, multimedia. Tambin puede producir mal aliento. La sensacin de acidez puede empeorar al [employer] o inclinarse, y suele ser ms intensa durante la noche. La acidez puede deberse al retroceso de los contenidos estomacales hacia el esfago (reflujo). [place] estas medidas para aliviar las molestias y [place] complicaciones. Dieta   Siga la dieta que le haya recomendado el mdico, la cual puede incluir evitar alimentos y bebidas tales como:  Caf y t (con o sin cafena).  Bebidas que contengan alcohol.  Bebidas energizantes y deportivas.  Gaseosas o refrescos.  Chocolate y cacao.  Menta y [place].  Ajo y cebollas.  Rbano picante.  Alimentos muy condimentados y cidos, entre ellos, pimientos, [place] en polvo, curry en polvo, vinagre, salsas picantes y salsa barbacoa.  Frutas ctricas y sus [place], [place]  naranjas, limones y limas.  Alimentos a base de tomates, como salsa roja, [place], salsa y pizza con salsa roja.  Alimentos fritos y Radio broadcast assistant, como rosquillas,  papas fritas y aderezos con alto contenido de Lobbyist.  Carnes con alto contenido de [place], como hot dogs y cortes grasos de carnes rojas y blancas, por ejemplo, filetes de entrecot, salchicha, jamn y tocino.  Productos lcteos con alto contenido de [place], como [place], [place] y queso crema.  Haga comidas pequeas y frecuentes Medical sales representative de comidas abundantes.  Evite beber [place] comidas.  No coma durante las 2 o 3horas previas a la hora de [place].  No se acueste inmediatamente despus de comer.  No haga actividad fsica enseguida despus de comer. Instrucciones generales   Est atento a cualquier cambio en los sntomas.  Tome los medicamentos de venta libre y los recetados solamente como se lo haya indicado el mdico. No tome aspirina, ibuprofeno ni otros antiinflamatorios no esteroides (AINE), a menos que se lo haya indicado el mdico.  No consuma ningn producto que contenga tabaco, lo que incluye cigarrillos, tabaco de Higher education careers adviser y Psychologist, sport and exercise. Si necesita ayuda para dejar de fumar, consulte al mdico.  Use ropas sueltas. No use prendas ajustadas alrededor de la cintura que ejerzan presin en el abdomen.  Levante (eleve) unas 6pulgadas (15centmetros) la cabecera de la cama.  Trate de reducir [employer] de estrs con actividades como el yoga o la meditacin. Si necesita ayuda para reducir [employer] de estrs, consulte al mdico.  Si tiene sobrepeso, Multimedia programmer un peso saludable. Hable con el mdico acerca de su peso ideal y pdale asesoramiento en cuanto a la dieta que debe seguir para Therapist, music.  Concurra a todas las visitas de control como se lo haya indicado el mdico. Esto es importante. SOLICITE ATENCIN MDICA SI:  Aparecen nuevos sntomas.  Baja de peso sin causa aparente.  Tiene dificultad para tragar o siente dolor al [employer].  Tiene sibilancias o tos persistente.  Los sntomas no mejoran con  Dispensing optician.  Tiene acidez frecuente durante ms de DIRECTV. [place] Rite Aid SI:  Tiene dolor en los brazos, el cuello, los [place], la dentadura o la espalda.  Philbert Riser, se marea o tiene sensacin de desvanecimiento.  Siente falta de aire o Tourist information centre manager.  Vomita y el vmito es parecido a la sangre o a los granos de caf.  Las heces son sanguinolentas o de color negro. Esta informacin no tiene Marine scientist el consejo del mdico. Asegrese de hacerle al mdico cualquier pregunta que tenga. Document Released: 06/25/2011 Document Revised: 07/04/2015 Document Reviewed: 02/07/2015 Elsevier Interactive Patient Education  2017 [employer].

## 2016-09-10 NOTE — Progress Notes (Addendum)
   Subjective:    Patient ID: Caroline Silva, female    DOB: [birth year], 60 y.o.   MRN: [id number]  HPI Patient is a 60yo female complaining of abdominal pain for two weeks. Patient describes the pain is burning after she eats. Patient also complains of gas, bloating and diarrhea for two weeks; however, she said she has been experiencing constipation this am. Patient took Mylanta and ginger soda with no improvement of symptoms. Patient state that her symptoms are worse after she eats and when she lays down. Patient states that when she eats, she has a difficult time swallowing and feels like her food gets stuck. Patient denies weight change, nausea and vomiting, chest pain and shortness of breath.    Review of Systems See HPI     Objective:   Physical Exam  Constitutional: She appears well-developed and well-nourished.  Neck: Neck supple. No tracheal deviation present. No thyromegaly present.  Cardiovascular: Normal rate and regular rhythm.   Pulmonary/Chest: Breath sounds normal. No respiratory distress. She has no wheezes. She has no rales.  Abdominal: She exhibits no distension and no mass. There is no tenderness. There is no rebound and no guarding.          Assessment & Plan:  Diagnoses and all orders for this visit:  Gastroesophageal reflux disease with esophagitis  Other orders -     omeprazole (PRILOSEC) 40 MG capsule; Take 1 capsule (40 mg total) by mouth daily.  Patient instructed to take medication as directed and to avoid spicy and tomato based foods to help with symptoms. HO given for food choices for GERD. If symptoms do not improve with Prilosec in 7 days, then call the office to be re-evaluated for possible esophageal stricture or achalasia.

## 2016-09-24 DIAGNOSIS — Z961 Presence of intraocular lens: Secondary | ICD-10-CM | POA: Diagnosis not present

## 2016-09-24 DIAGNOSIS — H401133 Primary open-angle glaucoma, bilateral, severe stage: Secondary | ICD-10-CM | POA: Diagnosis not present

## 2016-09-24 DIAGNOSIS — H2511 Age-related nuclear cataract, right eye: Secondary | ICD-10-CM | POA: Diagnosis not present

## 2017-02-02 DIAGNOSIS — H2511 Age-related nuclear cataract, right eye: Secondary | ICD-10-CM | POA: Diagnosis not present

## 2017-02-02 DIAGNOSIS — H401133 Primary open-angle glaucoma, bilateral, severe stage: Secondary | ICD-10-CM | POA: Diagnosis not present

## 2017-02-02 DIAGNOSIS — Z961 Presence of intraocular lens: Secondary | ICD-10-CM | POA: Diagnosis not present

## 2017-02-10 DIAGNOSIS — R011 Cardiac murmur, unspecified: Secondary | ICD-10-CM | POA: Diagnosis not present

## 2017-02-10 DIAGNOSIS — J209 Acute bronchitis, unspecified: Secondary | ICD-10-CM | POA: Diagnosis not present

## 2017-02-13 ENCOUNTER — Encounter: Payer: Self-pay | Admitting: Physician Assistant

## 2017-02-13 ENCOUNTER — Ambulatory Visit (INDEPENDENT_AMBULATORY_CARE_PROVIDER_SITE_OTHER): Payer: BLUE CROSS/BLUE SHIELD | Admitting: Physician Assistant

## 2017-02-13 VITALS — BP 119/79 | HR 53 | Wt 184.0 lb

## 2017-02-13 DIAGNOSIS — B351 Tinea unguium: Secondary | ICD-10-CM

## 2017-02-13 DIAGNOSIS — Z1231 Encounter for screening mammogram for malignant neoplasm of breast: Secondary | ICD-10-CM | POA: Diagnosis not present

## 2017-02-13 DIAGNOSIS — Z7689 Persons encountering health services in other specified circumstances: Secondary | ICD-10-CM | POA: Diagnosis not present

## 2017-02-13 DIAGNOSIS — Z1211 Encounter for screening for malignant neoplasm of colon: Secondary | ICD-10-CM | POA: Diagnosis not present

## 2017-02-13 DIAGNOSIS — Z1239 Encounter for other screening for malignant neoplasm of breast: Secondary | ICD-10-CM

## 2017-02-13 DIAGNOSIS — R011 Cardiac murmur, unspecified: Secondary | ICD-10-CM | POA: Diagnosis not present

## 2017-02-13 MED ORDER — TERBINAFINE HCL 250 MG PO TABS: 250.0000 mg | ORAL_TABLET | Freq: Every day | ORAL | 0 refills | Status: DC

## 2017-02-13 NOTE — Progress Notes (Signed)
HPI:                                                                Caroline Silva is a 61 y.o. female who presents to [facility]: [facility] today to establish care   Current Concerns include murmur  Patient was seen in urgent care on 02/10/17 for acute bronchitis and was told she had a murmur. Patient denies any history of rheumatic heart disease or untreated strep pharyngitis. She denies orthopnea, chest pain with exertion, dyspnea on exertion, SOB, PND, edema, or syncope.   Health Maintenance Health Maintenance  Topic Date Due  . Hepatitis C Screening  [birth year]  . HIV Screening  07/19/1972  . TETANUS/TDAP  07/19/1976  . COLONOSCOPY  07/20/2007  . MAMMOGRAM  01/25/2016  . PAP SMEAR  01/20/2017  . INFLUENZA VACCINE  05/27/2017    GYN/Sexual Health  Menstrual status: postmenopausal  Last pap smear: 01/20/2014, NILM HPV+  History of abnormal pap smears: HPV  Past Medical History:  Diagnosis Date  . Prediabetes 06/30/2012   No past surgical history on file. Social History  Substance Use Topics  . Smoking status: Former Research scientist (life sciences)  . Smokeless tobacco: Not on file  . Alcohol use No   family history is not on file.  ROS: negative except as noted in the HPI  Medications: Current Outpatient Prescriptions  Medication Sig Dispense Refill  . azithromycin (ZITHROMAX) 250 MG tablet Take 2 tablets (500 mg) on  Day 1,  followed by 1 tablet (250 mg) once daily on Days 2 through 5.    . benzonatate (TESSALON) 200 MG capsule Take by mouth.    . Multiple Vitamins-Minerals (MULTIVITAMIN ADULT PO) Take by mouth.    Marland Kitchen omeprazole (PRILOSEC) 40 MG capsule Take 1 capsule (40 mg total) by mouth daily. 30 capsule 2  . promethazine-dextromethorphan (PROMETHAZINE-DM) 6.25-15 MG/5ML syrup Take by mouth.     No current facility-administered medications for this visit.    No Known Allergies     Objective:  BP 119/79   Pulse (!) 53   Wt 184 lb (83.5  kg)   BMI 30.62 kg/m  Gen: well-groomed, cooperative, not ill-appearing, no distress Pulm: Normal work of breathing, normal phonation, clear to auscultation bilaterally CV: bradycardic, regular rhythm, s1 and s2 distinct, grade II/VI systolic murmur at LUSB, does not radiate, does not accentuate with valsava Neuro: alert and oriented x 3, EOM's intact, normal tone, no tremor MSK: moving all extremities, normal gait and station, no peripheral edema Psych: good eye contact, normal affect, euthymic mood, normal speech and thought content  No results found for this or any previous visit (from the past 72 hour(s)). No results found.    Assessment and Plan: 61 y.o. female with   1. Colon cancer screening - Ambulatory referral to Gastroenterology for colonoscopy  2. Breast cancer screening - MM DIGITAL SCREENING BILATERAL; Future  3. Encounter to establish care - CBC - COMPLETE METABOLIC PANEL WITH GFR - Lipid Panel w/reflex Direct LDL - TSH  4. Systolic ejection murmur - reassuring history and physical exam. Will order echo to r/o any valvular disease - ECHOCARDIOGRAM COMPLETE; Future  5. Onychomycosis - terbinafine (LAMISIL) 250 MG tablet; Take 1 tablet (250 mg total) by mouth  daily.  Dispense: 90 tablet; Refill: 0  Patient education and anticipatory guidance given Patient agrees with treatment plan Follow-up in 1 month for CPE with Pap or sooner as needed  Darlyne Russian PA-C

## 2017-02-13 NOTE — Patient Instructions (Signed)
-   Go downstairs for labs - Schedule your annual physical exam and Pap smear  - You will get a phone call to schedule your Echocardiogram of your heart  Ecocardiograma (Echocardiogram) El ecocardiograma utiliza ondas sonoras (ultrasonido) para obtener una imagen del corazn. El ecocardiograma es simple e indoloro, se obtiene en un perodo de tiempo breve y proporciona informacin valiosa a su mdico. Las imgenes de un ecocardiograma pueden proporcionar el siguiente tipo de informacin:  Evidencia de enfermedad arterial coronaria (EAC).  Tamao del corazn.  Funcin del msculo cardaco.  Funcin de la vlvula cardaca.  Deteccin de aneurisma.  Evidencia de un infarto de miocardio pasado.  Acumulacin de lquido alrededor del corazn.  Engrosamiento del msculo cardaco.  Evaluacin de la funcin de la vlvula cardaca. INFORME A SU MDICO:  Cualquier alergia que tenga.  Todos los [employer], incluidos vitaminas, hierbas, gotas oftlmicas, cremas y medicamentos de venta libre.  Problemas previos que usted o los [employer] de su familia hayan tenido con el uso de anestsicos.  Enfermedades de la sangre que tenga.  Cirugas previas.  Enfermedades que tenga.  Posibilidad de embarazo, si corresponde. ANTES DEL PROCEDIMIENTO No se requiere una preparacin especial. Coma y beba con normalidad. PROCEDIMIENTO  Para lograr una imagen del corazn, se aplicar un gel en el pecho y luego se pasar un instrumento similar a una vara (transductor) sobre este. El gel ayudar a transmitir las [employer] del transductor. Las ondas sonoras rebotarn inofensivamente en el corazn para permitir capturar las imgenes del corazn en movimiento, en tiempo real. Luego, las imgenes se Clinical cytogeneticist.  Quiz necesite una va IV para recibir un medicamento que mejore la calidad de las imgenes. DESPUS DEL PROCEDIMIENTO Puede retomar su rutina normal, incluidos la dieta, las  actividades y [employer], a menos que su mdico le indique lo contrario. Esta informacin no tiene Marine scientist el consejo del mdico. Asegrese de hacerle al mdico cualquier pregunta que tenga. Document Released: 10/13/2005 Document Revised: 11/03/2014 Document Reviewed: 06/20/2013 Elsevier Interactive Patient Education  2017 [employer].

## 2017-02-14 LAB — LIPID PANEL W/REFLEX DIRECT LDL
CHOLESTEROL: 204 mg/dL — AB (ref <200)
HDL: 33 mg/dL — ABNORMAL LOW (ref >50)
LDL-CHOLESTEROL: 129 mg/dL — AB
Non-HDL Cholesterol (Calc): 171 mg/dL — ABNORMAL HIGH (ref <130)
TRIGLYCERIDES: 276 mg/dL — AB (ref <150)
Total CHOL/HDL Ratio: 6.2 Ratio — ABNORMAL HIGH (ref <5.0)

## 2017-02-14 LAB — COMPLETE METABOLIC PANEL WITH GFR
ALT: 11 U/L (ref 6–29)
AST: 19 U/L (ref 10–35)
Albumin: 4.5 g/dL (ref 3.6–5.1)
Alkaline Phosphatase: 86 U/L (ref 33–130)
BILIRUBIN TOTAL: 0.4 mg/dL (ref 0.2–1.2)
BUN: 13 mg/dL (ref 7–25)
CHLORIDE: 105 mmol/L (ref 98–110)
CO2: 27 mmol/L (ref 20–31)
CREATININE: 0.73 mg/dL (ref 0.50–1.05)
Calcium: 10.1 mg/dL (ref 8.6–10.4)
Glucose, Bld: 76 mg/dL (ref 65–99)
POTASSIUM: 4.3 mmol/L (ref 3.5–5.3)
Sodium: 140 mmol/L (ref 135–146)
Total Protein: 7.5 g/dL (ref 6.1–8.1)

## 2017-02-14 LAB — CBC
HCT: 39.9 % (ref 35.0–45.0)
Hemoglobin: 13.4 g/dL (ref 11.7–15.5)
MCH: 30.9 pg (ref 27.0–33.0)
MCHC: 33.6 g/dL (ref 32.0–36.0)
MCV: 92.1 fL (ref 80.0–100.0)
MPV: 10.4 fL (ref 7.5–12.5)
PLATELETS: 283 10*3/uL (ref 140–400)
RBC: 4.33 MIL/uL (ref 3.80–5.10)
RDW: 12.8 % (ref 11.0–15.0)
WBC: 5.4 10*3/uL (ref 3.8–10.8)

## 2017-02-14 LAB — TSH: TSH: 2.22 mIU/L

## 2017-02-16 ENCOUNTER — Other Ambulatory Visit: Payer: Self-pay

## 2017-02-16 DIAGNOSIS — B351 Tinea unguium: Secondary | ICD-10-CM

## 2017-02-16 MED ORDER — TERBINAFINE HCL 250 MG PO TABS: 250.0000 mg | ORAL_TABLET | Freq: Every day | ORAL | 0 refills | Status: DC

## 2017-02-17 ENCOUNTER — Encounter: Payer: Self-pay | Admitting: Physician Assistant

## 2017-02-17 ENCOUNTER — Other Ambulatory Visit (HOSPITAL_COMMUNITY)
Admission: RE | Admit: 2017-02-17 | Discharge: 2017-02-17 | Disposition: A | Discharge status: Home / Self Care | Payer: BLUE CROSS/BLUE SHIELD | Source: Ambulatory Visit | Attending: Physician Assistant | Admitting: Physician Assistant

## 2017-02-17 ENCOUNTER — Ambulatory Visit (INDEPENDENT_AMBULATORY_CARE_PROVIDER_SITE_OTHER): Payer: BLUE CROSS/BLUE SHIELD | Admitting: Physician Assistant

## 2017-02-17 VITALS — BP 117/76 | HR 60 | Wt 182.0 lb

## 2017-02-17 DIAGNOSIS — E66811 Obesity, class 1: Secondary | ICD-10-CM

## 2017-02-17 DIAGNOSIS — R7303 Prediabetes: Secondary | ICD-10-CM | POA: Diagnosis not present

## 2017-02-17 DIAGNOSIS — E781 Pure hyperglyceridemia: Secondary | ICD-10-CM

## 2017-02-17 DIAGNOSIS — Z124 Encounter for screening for malignant neoplasm of cervix: Secondary | ICD-10-CM

## 2017-02-17 DIAGNOSIS — Z6830 Body mass index (BMI) 30.0-30.9, adult: Secondary | ICD-10-CM | POA: Diagnosis not present

## 2017-02-17 DIAGNOSIS — E6609 Other obesity due to excess calories: Secondary | ICD-10-CM | POA: Diagnosis not present

## 2017-02-17 HISTORY — DX: Pure hyperglyceridemia: E78.1

## 2017-02-17 LAB — POCT GLYCOSYLATED HEMOGLOBIN (HGB A1C): Hemoglobin A1C: 6.2

## 2017-02-17 NOTE — Progress Notes (Deleted)
HPI:                                                                Gavriella Hearst is a 61 y.o. female who presents to [facility]: [facility] today for CPE and Pap    Past Medical History:  Diagnosis Date  . Cataract   . Glaucoma   . Heart murmur   . Obesity   . Prediabetes 06/30/2012   Past Surgical History:  Procedure Laterality Date  . GLAUCOMA SURGERY     Social History  Substance Use Topics  . Smoking status: Never Smoker  . Smokeless tobacco: Never Used  . Alcohol use No   family history includes Diabetes in her brother.  ROS: negative except as noted in the HPI  Medications: Current Outpatient Prescriptions  Medication Sig Dispense Refill  . azithromycin (ZITHROMAX) 250 MG tablet Take 2 tablets (500 mg) on  Day 1,  followed by 1 tablet (250 mg) once daily on Days 2 through 5.    . benzonatate (TESSALON) 200 MG capsule Take by mouth.    . Multiple Vitamins-Minerals (MULTIVITAMIN ADULT PO) Take by mouth.    Marland Kitchen omeprazole (PRILOSEC) 40 MG capsule Take 1 capsule (40 mg total) by mouth daily. 30 capsule 2  . promethazine-dextromethorphan (PROMETHAZINE-DM) 6.25-15 MG/5ML syrup Take by mouth.    . terbinafine (LAMISIL) 250 MG tablet Take 1 tablet (250 mg total) by mouth daily. 90 tablet 0   No current facility-administered medications for this visit.    No Known Allergies     Objective:  BP 117/76   Pulse 60   Wt 182 lb (82.6 kg)   BMI 30.29 kg/m  Gen: well-groomed, cooperative, not ill-appearing, no distress HEENT: normal conjunctiva, TM's ***, nasal mucosa ***, oropharynx clear, moist mucus membranes, no frontal or maxillary sinus tenderness Pulm: Normal work of breathing, normal phonation, clear to auscultation bilaterally, no wheezes, rales or rhonchi CV: Normal rate, regular rhythm, s1 and s2 distinct, no murmurs, clicks or rubs  GI: soft, nondistended, nontender Neuro: alert and oriented x 3, EOM's intact Lymph: no  cervical or tonsillar adenopathy Skin: warm and dry, no rashes or lesions on exposed skin, no cyanosis   No results found for this or any previous visit (from the past 72 hour(s)). No results found.    Assessment and Plan: 61 y.o. female with ***    Patient education and anticipatory guidance given Patient agrees with treatment plan Follow-up as needed if symptoms worsen or fail to improve  Darlyne Russian PA-C

## 2017-02-17 NOTE — Patient Instructions (Addendum)
Hiperglucemia (Hyperglycemia) La hiperglucemia se produce cuando el nivel de azcar (glucosa) en la sangre es demasiado alto. Algunos de los sntomas de nivel alto de azcar en la sangre son los siguientes:  Sentir que tiene lo siguiente:  Estar ms sediento que lo habitual.  Sentirse cansado.  Estar dbil.  Tener lo siguiente:  La boca seca.  Cambios en la visin, como visin borrosa.  Aliento con USAA a fruta.  Aumento o prdida de peso no planificados. Probablemente pierda de peso muy rpidamente.  Hormigueo o adormecimiento en las manos o los pies.  Dolor de [place].  Cuando se pellizca la piel, esta no vuelve rpidamente a su lugar al soltarla.  Dolor en el vientre (abdomen).  Cortes o hematomas que tardan en curarse.  Orinar con mayor frecuencia que lo normal.  Tener ms hambre de lo habitual.  Tener menos deseos de comer que lo habitual (prdida del apetito). La hiperglucemia le puede ocurrir tanto a las personas que tienen diabetes como a las que no la tienen. Puede desarrollarse despacio o rpidamente y ser una emergencia mdica. CUIDADOS EN EL HOGAR  Si tiene diabetes, siga su plan de control de la diabetes. Haga lo siguiente:  [employer]phi segn las indicaciones.  Siga el plan de ejercicio.  Siga el plan de comidas.  Controle su nivel de azcar en la sangre peridicamente.  Controle su nivel de azcar en la sangre antes y despus de ejercitarse. Si hace ejercicio durante ms tiempo o de [employer] de lo habitual, asegrese de Chief Technology Officer su nivel de azcar en la sangre con mayor frecuencia.  Use su pulsera de alerta mdica, que indica que usted tiene diabetes.  Beba suficiente lquido para mantener el pis claro o de color amarillo plido.  Mantenga un peso saludable con dieta y ejercicios. Pregntele a su mdico cul es su peso ideal.  No consuma ningn producto que contenga tabaco, lo que incluye cigarrillos, tabaco de Higher education careers adviser y  Psychologist, sport and exercise. Si necesita ayuda para dejar de fumar, consulte al mdico.  Limite el consumo de alcohol a no ms de 67medida por da si es mujer y no est embarazada y a 36medidas por da si es hombre. Una medida equivale a 12onzas de cerveza, 5onzas de vino o 1onzas de bebidas alcohlicas de alta graduacin.  Concurra a todas las visitas de control como se lo haya indicado el mdico. Esto es importante. SOLICITE AYUDA SI:  Su nivel de azcar en la sangre est por encima del rango indicado en dos mediciones seguidas.  Tiene episodios frecuentes hiperglucemia. SOLICITE AYUDA DE INMEDIATO SI:  Tiene dificultad para respirar.  Tiene cambios en la manera se sentirse, pensar o actuar (estado mental).  Tiene ganas constantes de vomitar (nuseas).  No puede dejar de vomitar. Estos sntomas pueden Sales executive. No espere hasta que los sntomas desaparezcan. Solicite atencin mdica de inmediato. Comunquese con el servicio de emergencias de su localidad (911 en los [place]). No conduzca por sus propios medios Principal Financial. Esta informacin no tiene Marine scientist el consejo del mdico. Asegrese de hacerle al mdico cualquier pregunta que tenga. Document Released: 11/15/2010 Document Revised: 02/04/2016 Document Reviewed: 06/19/2015 Elsevier Interactive Patient Education  2017 [place] de alimentos para bajar el nivel de triglicridos (Food Choices to Lower Your Triglycerides) Los triglicridos son un tipo de grasas que se Chief Strategy Officer. Un nivel elevado de triglicridos puede aumentar el riesgo de padecer enfermedades cardacas e infartos. Si  sus niveles de triglicridos son altos, los alimentos que se ingieren y los hbitos de alimentacin son Theatre stage manager. Elegir los alimentos adecuados puede ayudar a Therapist, nutritional de triglicridos. [place]?  Baje de peso si es necesario.  Limite o evite el  alcohol.  Llene la mitad del plato con vegetales y ensaladas de hojas verdes.  [place] a dos porciones por da. Elija frutas en lugar de jugos.  Ocupe un cuarto del plato con cereales integrales. Busque la palabra "integral" en Equities trader de la lista de ingredientes.  Llene un cuarto del plato con alimentos con protenas magras.  Disfrute de pescados grasos (como salmn, caballa, sardinas y atn) tres veces por semana.  Pajaros grasas saludables.  Limite los alimentos con alto contenido de almidn y Location manager.  Consuma ms comida casera y menos de restaurante, de buf y comida rpida.  Limite el consumo de alimentos fritos.  Cocine los alimentos utilizando mtodos que no sean la fritura.  Limite el consumo de grasas saturadas.  Verifique las listas de ingredientes para evitar alimentos con aceites parcialmente hidrogenados (grasas trans). QU ALIMENTOS PUEDO COMER? Cereales Cereales integrales, como los panes de salvado o [place], las [place], los cereales y las pastas. Avena sin endulzar, trigo, [place], quinua o arroz integral. Tortillas de harina de maz o de salv[place]. Vegetales Verduras frescas o congeladas (crudas, al vapor, asadas o grilladas). Ensaladas de hojas verdes. Fruits Frutas frescas, en conserva (en su jugo natural) o frutas congeladas. Carnes y otros productos con protenas Carne de res molida (al 85% o ms [place]), carne de res de animales alimentados con pastos o carne de res sin la grasa. Pollo o pavo sin piel. Carne de pollo o de [place]. Cerdo sin la grasa. Todos los pescados y frutos de mar. Huevos. Porotos, guisantes o lentejas secos. Frutos secos o semillas sin sal. Frijoles secos o en lata sin sal. Lcteos Productos lcteos con bajo contenido de grasas, como [place] o al 1%, quesos reducidos en grasas o al 2%, ricota con bajo contenido de grasas o [employer], o yogur natural con bajo contenido de [place]. Grasas y  Naval architect en barra que no contengan grasas trans. [place]ones[place] y condimentos para ensaladas livianos o reducidos en grasas. [place]. Aceites de crtamo, oliva o canola. [place]equilla natural de man o almendra. Los artculos mencionados arriba pueden no ser [employer] Foods Company de las bebidas o los alimentos recomendados. Comunquese con el nutricionista para conocer ms opciones. QU ALIMENTOS NO SE RECOMIENDAN? Cereales Pan blanco. Pastas blancas. Arroz blanco. Pan de maz. Bagels, pasteles y croissants. Galletas saladas que contengan grasas trans. Vegetales Papas blancas. Maz. Vegetales con crema o fritos. Verduras en [place]. Fruits Frutas secas. Fruta enlatada en almbar liviano o espeso. Jugo de frutas. Carnes y otros productos con protenas Cortes de carne con Lobbyist. Costillas, alas de pollo, tocineta, salchicha, mortadela, salame, chinchulines, tocino, perros calientes, salchichas alemanas y embutidos envasados. Lcteos Leche entera o al 2%, crema, mezcla de [place] y crema y queso crema. Yogur entero o endulzado. Quesos con toda su grasa. Cremas no lcteas y coberturas batidas. Quesos procesados, quesos para untar o cuajadas. Dulces y postres Jarabe de maz, azcares, miel y Control and instrumentation engineer. Caramelos. Mermelada y [place]. Chrissie Noa. Cereales endulzados. Galletas, pasteles, bizcochuelos, donas, muffins y helado. Grasas y aceites [place], [place] en barra, [place] de [place], [place], [place] clarificada o grasa de tocino. Aceites de coco, de palmiste o de palma. Bebidas Alcohol. Bebidas  endulzadas (como refrescos, limonadas y bebidas frutales o ponches). Los artculos mencionados arriba pueden no ser [employer] Foods Company de las bebidas y los alimentos que se Higher education careers adviser. Comunquese con el nutricionista para recibir ms informacin. Esta informacin no tiene Marine scientist el consejo del mdico. Asegrese de hacerle al mdico cualquier pregunta que tenga. Document Released:  04/02/2010 Document Revised: 10/18/2013 Document Reviewed: 08/17/2013 Elsevier Interactive Patient Education  2017 [employer].

## 2017-02-19 ENCOUNTER — Encounter: Payer: Self-pay | Admitting: Physician Assistant

## 2017-02-19 LAB — CYTOLOGY - PAP
ADEQUACY: ABSENT
Diagnosis: NEGATIVE
HPV: NOT DETECTED

## 2017-02-19 NOTE — Progress Notes (Signed)
HPI:                                                                Caroline Silva is a 61 y.o. female who presents to [place]: [facility] today for CPE and Pap smear  Current Concerns include none  Patient has no concerns today. She has a history of HPV + Pap on 01/20/2014 that was NILM. She denies vaginal bleeding, discharge, or dyspareunia. She denies breast pain, palpable lumps, or skin changes. Last mammogram was 01/24/2014 and negative   Health Maintenance Health Maintenance  Topic Date Due  . Hepatitis C Screening  07-11-1956  . HIV Screening  07/19/1972  . TETANUS/TDAP  07/19/1976  . COLONOSCOPY  07/20/2007  . MAMMOGRAM  01/25/2016  . INFLUENZA VACCINE  05/27/2017  . PAP SMEAR  01/21/2019     Past Medical History:  Diagnosis Date  . Cataract   . Glaucoma   . Heart murmur   . Hypertriglyceridemia without hypercholesterolemia 02/17/2017  . Obesity   . Prediabetes 06/30/2012   Past Surgical History:  Procedure Laterality Date  . GLAUCOMA SURGERY     Social History  Substance Use Topics  . Smoking status: Never Smoker  . Smokeless tobacco: Never Used  . Alcohol use No   family history includes Diabetes in her brother.  ROS: negative except as noted in the HPI  Medications: Current Outpatient Prescriptions  Medication Sig Dispense Refill  . Multiple Vitamins-Minerals (MULTIVITAMIN ADULT PO) Take by mouth.    Marland Kitchen omeprazole (PRILOSEC) 40 MG capsule Take 1 capsule (40 mg total) by mouth daily. 30 capsule 2  . terbinafine (LAMISIL) 250 MG tablet Take 1 tablet (250 mg total) by mouth daily. 90 tablet 0   No current facility-administered medications for this visit.    No Known Allergies     Objective:  BP 117/76   Pulse 60   Wt 182 lb (82.6 kg)   BMI 30.29 kg/m  Gen: well-groomed, cooperative, not ill-appearing, no distress HEENT: normal conjunctiva, TM's clear, oropharynx clear, moist mucus membranes, neck supple,  no thyromegaly or tenderness Pulm: Normal work of breathing, normal phonation, clear to auscultation bilaterally CV: Normal rate, regular rhythm, s1 and s2 distinct, grade II/VI systolic murmur at LUSB, no clicks or rubs, no carotid bruit GI: abdomen soft, nondistended, nontender, no masses GU: vulva without rashes or lesions, normal introitus and urethral meatus, vaginal mucosa without erythema, normal discharge, cervix non-friable without lesions, adnexa without masses or tenderness, uterus non-tender and not enlarged Lymph: no inguinal adenopathy Neuro: alert and oriented x 3, EOM's intact, PERRLA, DTR's intact, normal tone, no tremor MSK: moving all extremities, normal gait and station, no peripheral edema Skin: warm and dry, no rashes or lesions on exposed skin Psych: normal affect, euthymic mood, normal speech and thought content   Depression screen [facility] 2/9 02/13/2017  Decreased Interest 2  Down, Depressed, Hopeless 2  PHQ - 2 Score 4  Altered sleeping 0  Tired, decreased energy 2  Change in appetite 1  Feeling bad or failure about yourself  1  Trouble concentrating 1  Moving slowly or fidgety/restless 1  Suicidal thoughts 0  PHQ-9 Score 10       Assessment and Plan: 61 y.o. female  with   1. Encounter for Pap smear of cervix with HPV DNA cotesting - Cytology - PAP   2. Hypertriglyceridemia without hypercholesterolemia - counseled on dietary changes to lower triglycerides  3. Prediabetes - stable - POCT HgB A1C 6.2 - counseled on food choices to reduce blood sugar  4. Class 1 obesity due to excess calories without serious comorbidity with body mass index (BMI) of 30.0 to 30.9 in adult - counseled on heart healthy lifestyle including increased cardiovascular exercise  5. Annual physical - screening Lipids, TSH, CMP - referral placed to GI for Colonoscopy  - screening mammogram ordered   Patient education and anticipatory guidance given Patient agrees with  treatment plan Follow-up in 1 year for CPE or sooner as needed  Darlyne Russian PA-C

## 2017-02-26 ENCOUNTER — Ambulatory Visit (HOSPITAL_BASED_OUTPATIENT_CLINIC_OR_DEPARTMENT_OTHER): Payer: BLUE CROSS/BLUE SHIELD

## 2017-03-02 DIAGNOSIS — Z1211 Encounter for screening for malignant neoplasm of colon: Secondary | ICD-10-CM | POA: Diagnosis not present

## 2017-03-02 DIAGNOSIS — K6389 Other specified diseases of intestine: Secondary | ICD-10-CM | POA: Diagnosis not present

## 2017-03-02 LAB — HM COLONOSCOPY

## 2017-03-18 ENCOUNTER — Ambulatory Visit (HOSPITAL_BASED_OUTPATIENT_CLINIC_OR_DEPARTMENT_OTHER)
Admission: RE | Admit: 2017-03-18 | Discharge: 2017-03-18 | Disposition: A | Discharge status: Home / Self Care | Payer: BLUE CROSS/BLUE SHIELD | Source: Ambulatory Visit | Attending: Physician Assistant | Admitting: Physician Assistant

## 2017-03-18 DIAGNOSIS — R011 Cardiac murmur, unspecified: Secondary | ICD-10-CM

## 2017-03-18 DIAGNOSIS — I340 Nonrheumatic mitral (valve) insufficiency: Secondary | ICD-10-CM | POA: Diagnosis not present

## 2017-03-18 NOTE — Progress Notes (Signed)
  Echocardiogram 2D Echocardiogram has been performed.  Caroline Silva 03/18/2017, 3:58 PM

## 2017-04-03 ENCOUNTER — Encounter: Payer: Self-pay | Admitting: Physician Assistant

## 2017-04-09 DIAGNOSIS — H401133 Primary open-angle glaucoma, bilateral, severe stage: Secondary | ICD-10-CM | POA: Diagnosis not present

## 2017-04-09 DIAGNOSIS — Z961 Presence of intraocular lens: Secondary | ICD-10-CM | POA: Diagnosis not present

## 2017-04-09 DIAGNOSIS — H2511 Age-related nuclear cataract, right eye: Secondary | ICD-10-CM | POA: Diagnosis not present

## 2017-05-21 DIAGNOSIS — H401133 Primary open-angle glaucoma, bilateral, severe stage: Secondary | ICD-10-CM | POA: Diagnosis not present

## 2017-08-03 DIAGNOSIS — H2511 Age-related nuclear cataract, right eye: Secondary | ICD-10-CM | POA: Diagnosis not present

## 2017-08-03 DIAGNOSIS — Z961 Presence of intraocular lens: Secondary | ICD-10-CM | POA: Diagnosis not present

## 2017-08-03 DIAGNOSIS — H401133 Primary open-angle glaucoma, bilateral, severe stage: Secondary | ICD-10-CM | POA: Diagnosis not present

## 2017-10-21 ENCOUNTER — Ambulatory Visit (INDEPENDENT_AMBULATORY_CARE_PROVIDER_SITE_OTHER): Payer: BLUE CROSS/BLUE SHIELD

## 2017-10-21 ENCOUNTER — Encounter: Payer: Self-pay | Admitting: Physician Assistant

## 2017-10-21 ENCOUNTER — Ambulatory Visit (INDEPENDENT_AMBULATORY_CARE_PROVIDER_SITE_OTHER): Payer: BLUE CROSS/BLUE SHIELD | Admitting: Physician Assistant

## 2017-10-21 VITALS — BP 111/73 | HR 60 | Temp 98.1°F | Resp 16 | Ht 66.0 in | Wt 177.4 lb

## 2017-10-21 DIAGNOSIS — E781 Pure hyperglyceridemia: Secondary | ICD-10-CM

## 2017-10-21 DIAGNOSIS — Z1231 Encounter for screening mammogram for malignant neoplasm of breast: Secondary | ICD-10-CM | POA: Diagnosis not present

## 2017-10-21 DIAGNOSIS — D234 Other benign neoplasm of skin of scalp and neck: Secondary | ICD-10-CM | POA: Insufficient documentation

## 2017-10-21 DIAGNOSIS — Z1239 Encounter for other screening for malignant neoplasm of breast: Secondary | ICD-10-CM

## 2017-10-21 DIAGNOSIS — Z1159 Encounter for screening for other viral diseases: Secondary | ICD-10-CM

## 2017-10-21 DIAGNOSIS — R7303 Prediabetes: Secondary | ICD-10-CM | POA: Diagnosis not present

## 2017-10-21 NOTE — Patient Instructions (Addendum)
Quiste epidrmico (Epidermal Cyst) Un quiste epidrmico a veces se denomina quiste de inclusin epidrmica o quiste infundibular. Es un saco de tejido cutneo. El saco contiene una sustancia llamada [place]. La queratina es una protena que normalmente se secreta a travs de folculos capilares. Cuando la queratina queda atrapada en la capa superior de la piel (epidermis), puede formar un quiste epidrmico. Los quistes epidrmicos normalmente se encuentran en la cara, el cuello, el tronco o los genitales. Estos quistes por lo general no son dainos (son benignos), y es posible que no causen sntomas, salvo que se infecten. Es importante que no apriete los quistes epidrmicos. CAUSAS Esta afeccin puede ser causada por lo siguiente:  Un folculo capilar bloqueado.  Un pelo que se riza y vuelve a Dietitian en la piel en vez de crecer hacia afuera de la piel (pelo encarnado).  Un poro bloqueado.  Irritacin de la piel.  Una lesin en la piel.  Ciertas afecciones que se transmiten de padres a hijos (hereditarias).  Virus del papiloma humano (VPH). FACTORES DE RIESGO Los siguientes factores pueden hacer que usted sea ms propenso a desarrollar un quiste epidrmico:  Acn.  Tener sobrepeso.  Usar ropa [place]. SNTOMAS El nico sntoma de esta afeccin puede ser un pequeo bulto no doloroso debajo de la piel. Cuando un quiste epidrmico se infecta, pueden aparecer algunos sntomas, como los siguientes:  Enrojecimiento.  Inflamacin.  Dolor a Secretary/administrator.  Calor.  Cristy Hilts.  Queratina que sale del [place]. La queratina puede verse como una sustancia blanca griscea con mal olor.  Pus que sale del quiste. DIAGNSTICO Esta afeccin se diagnostica mediante un examen fsico. En algunos casos, pueden tomarle una muestra de tejido (biopsia) del quiste para examinarla con un microscopio o hacerle anlisis para ver si hay bacterias. Pueden derivarlo a un mdico especialista en el cuidado  de la piel (dermatlogo). TRATAMIENTO En muchos casos, los quistes epidrmicos desaparecen por s solos, sin tratamiento. Si un quiste se infecta, el tratamiento puede incluir lo siguiente:  Harlon Ditty y Training and development officer. Despus del drenaje, se puede hacer una ciruga menor para [employer]o restos del [place].  Antibiticos como ayuda para prevenir una infeccin.  Inyecciones de medicamentos (corticoides) que ayudan a Risk manager.  Ciruga para extirpar el quiste. Se puede realizar una ciruga si: ? El quiste se agranda. ? El [employer]. ? Existe la posibilidad de que el quiste se [place] en un tipo de cncer. [place] los medicamentos de venta libre y los recetados solamente como se lo haya indicado el mdico.  Si le recetaron un antibitico, tmelo como se lo haya indicado el mdico. No deje de usar el antibitico aunque comience a Sports administrator.  Mantenga el rea que rodea el quiste limpia y [place].  Use ropa suelta y seca.  No intente apretar el quiste.  Evite tocar el quiste.  Revise el [employer] Media para detectar signos de infeccin.  Concurra a todas las visitas de control como se lo haya indicado el mdico. Esto es importante. PREVENCIN  Use ropa limpia y [place].  Evite usar ropa [place].  Mantenga la piel limpia y seca. Tome una ducha o un bao [employer].  Lvese el cuerpo con perxido de benzolo cuando tome una ducha o un bao. SOLICITE ATENCIN MDICA SI:  El quiste desarrolla sntomas de infeccin.  El problema no mejora, o empeora.  Le aparece un quiste con un aspecto diferente  de otros quistes que ha tenido.  Tiene fiebre. SOLICITE ATENCIN MDICA DE INMEDIATO SI:  Aparece enrojecimiento en el quiste que se propaga hacia el rea que lo rodea. Esta informacin no tiene Marine scientist el consejo del mdico. Asegrese de hacerle al mdico cualquier pregunta que tenga. Document  Released: 11/24/2006 Document Revised: 01/05/2012 Document Reviewed: 08/15/2015 Elsevier Interactive Patient Education  2018 [place] de alimentacin para la prediabetes (Prediabetes Eating Plan) La prediabetes, tambin llamada intolerancia a la glucosa o alteracin de la glucosa en ayunas, es una afeccin que eleva los niveles de azcar en la sangre (glucemia) por encima de lo normal. Seguir una dieta saludable puede ayudar a mantener la prediabetes bajo control, y tambin reduce el riesgo de tener diabetes tipo2 y cardiopata, que es ms alto en las personas que tienen esta afeccin. Junto con la actividad fsica habitual, una dieta saludable:  Promueve la prdida de [place].  Ayuda a Environmental consultant de Dispensing optician.  Ayuda a mejorar la forma en que el organismo [place] la insulina. QU DEBO SABER ACERCA DE ESTE PLAN DE [place]?  Use el ndice glucmico (IG) para planificar las comidas. El ndice le informa con qu rapidez un alimento elevar su nivel de azcar en la sangre. Elija los alimentos con bajo IG. Estos tardan ms tiempo en subir el nivel de azcar en la sangre.  Preste mucha atencin a la cantidad de hidratos de carbono que hay en los alimentos que consume. Los hidratos de carbono [employer] niveles de Dispensing optician.  Lleve un registro de la cantidad de caloras que ingiere. Ingerir la cantidad correcta de caloras lo ayudar a Development worker, international aid peso saludable. Bajar alrededor del 7por ciento del peso inicial puede ayudar a Product/process development scientist la diabetes tipo2.  Tal vez deba seguir Web designer. Esta incluye una gran cantidad de verduras, carnes magras o pescado, cereales integrales, frutas, as como aceites y grasas saludables.  QU ALIMENTOS PUEDO COMER? Cereales Cereales integrales, como panes, galletas, cereales y pastas de salvado o integrales. Avena sin azcar. Trigo burgol. Cebada. Quinua. Arroz integral. Tortillas o tacos de harina de maz o  de salvado. [place] Valeda Malm. [place]. Guisantes. Remolachas. Coliflor. Repollo. Brcoli. Zanahorias. Tomates. [place]. Augustin Coupe. Hierbas. Pimientos. Cebollas. Pepinos. Repollitos de Bruselas. Frutas Frutos rojos. Bananas. Manzanas. Naranjas. Uvas. Papaya. Mango. [place]. Kiwi. Pomelo. Cerezas. Carnes y otras fuentes de protenas [place]iscos. Carnes [place], entre ellas, pollo y [place] o cortes magros de carne de cerdo y de [place]. Tofu. Huevos. Los frutos secos. Frijoles. Lcteos Productos lcteos descremados o semidescremados, como yogur, queso cottage y [place]. [employer]et Healthcare. T. Caf. Gaseosas sin azcar o dietticas. Agua de [place]. Leche. Productos alternativos [place], como leche de soja o de [place]. Condimentos [place]. Salsa de pepinillos. [place] con bajo contenido de [place] y de Location manager. Salsa barbacoa con bajo contenido de grasa y de azcar. Mayonesa sin grasa o con bajo contenido de [place]. Dulces y postres [place] sin azcar o con bajo contenido de [place]. Helados y otros dulces congelados sin azcar o con bajo contenido de [place]. Grasas y Medical laboratory scientific officer. Nueces. Aceite de oliva. Los artculos mencionados arriba pueden no ser Dean Foods Company de las bebidas o los alimentos recomendados. Comunquese con el nutricionista para conocer ms opciones. QU ALIMENTOS NO SE RECOMIENDAN? Cereales Productos a base de [place] y de [place], como panes, pastas, bocadillos y cereales. Bebidas Bebidas azucaradas, como t helado y gaseosas con Location manager. Dulces y [employer]  Productos de [place], como tortas, [place], pasteles, Museum/gallery exhibitions officer y tarta de [place]. Los artculos mencionados arriba pueden no ser Dean Foods Company de las bebidas y los alimentos que se Higher education careers adviser. Comunquese con el nutricionista para obtener ms informacin. Esta informacin no tiene Marine scientist el consejo del mdico. Asegrese de hacerle al mdico cualquier pregunta que tenga. Document Released:  07/04/2015 Document Revised: 07/04/2015 Document Reviewed: 11/08/2014 Elsevier Interactive Patient Education  2017 [employer].

## 2017-10-21 NOTE — Progress Notes (Signed)
HPI:                                                                Caroline Silva is a 61 y.o. female who presents to [place]: [place] today for scalp mass  Patient reports a nontender nodule on the left scalp that has been present for approximately 8 years. It does not cause her any symptoms. It has never become inflamed, red, tender, or drained. She came in today to "make sure it was okay."  Past Medical History:  Diagnosis Date  . Cataract   . Glaucoma   . Heart murmur   . Hypertriglyceridemia without hypercholesterolemia 02/17/2017  . Obesity   . Prediabetes 06/30/2012   Past Surgical History:  Procedure Laterality Date  . GLAUCOMA SURGERY     Social History   Tobacco Use  . Smoking status: Never Smoker  . Smokeless tobacco: Never Used  Substance Use Topics  . Alcohol use: No   family history includes Diabetes in her brother.  ROS: negative except as noted in the HPI  Medications: Current Outpatient Medications  Medication Sig Dispense Refill  . Multiple Vitamins-Minerals (MULTIVITAMIN ADULT PO) Take by mouth.    Marland Kitchen omeprazole (PRILOSEC) 40 MG capsule Take 1 capsule (40 mg total) by mouth daily. 30 capsule 2   No current facility-administered medications for this visit.    No Known Allergies     Objective:  BP 111/73   Pulse 60   Temp 98.1 F (36.7 C) (Oral)   Resp 16   Ht 5\' 6"  (1.676 m)   Wt 177 lb 6.4 oz (80.5 kg)   SpO2 100%   BMI 28.63 kg/m  Gen:  alert, not ill-appearing, no distress, appropriate for age 47: head normocephalic without obvious abnormality, trachea midline Pulm: Normal work of breathing, normal phonation, clear to auscultation bilaterally, no wheezes, rales or rhonchi CV: Normal rate, regular rhythm, s1 and s2 distinct, no murmurs, clicks or rubs  Neuro: alert and oriented x 3, no tremor MSK: extremities atraumatic, normal gait and station Skin: intact, no rashes, left parietal scalp  with 1.5 cm well-circumscribed flesh-colored nodule, no fluctuance or induration   No results found for this or any previous visit (from the past 72 hour(s)). No results found.    Assessment and Plan: 61 y.o. female with   1. Dermoid cyst of scalp - no evidence of abscess or inflammation - active surveillance - discussed option for surgical excision if it becomes symptomatic  2. Prediabetes - overdue for recheck A1C Q78months - counseled on prediabetes eating plan - HgB A1c  3. Encounter for hepatitis C screening test for low risk patient - Hepatitis C antibody   Patient education and anticipatory guidance given Patient agrees with treatment plan Follow-up in 5 months for CPE w/fasting labs or sooner as needed if symptoms worsen or fail to improve  Darlyne Russian PA-C

## 2017-10-22 LAB — HEMOGLOBIN A1C
HEMOGLOBIN A1C: 6.4 %{Hb} — AB (ref <5.7)
MEAN PLASMA GLUCOSE: 137 (calc)
eAG (mmol/L): 7.6 (calc)

## 2017-10-22 LAB — HEPATITIS C ANTIBODY
Hepatitis C Ab: NONREACTIVE
SIGNAL TO CUT-OFF: 0.02 (ref <1.00)

## 2017-10-22 NOTE — Addendum Note (Signed)
Addended by: Nelson Chimes E on: 10/22/2017 11:58 AM   Modules accepted: Orders

## 2017-10-22 NOTE — Progress Notes (Signed)
Mammogram was negative. Recommend repeat screening in 1 year A1c has increased to 6.4. This is still prediabetes, but at 6.5 it is considered type 2 diabetes. I would recommend adding a low-dose of Metformin to treat insulin resistance in addition to low-carb diet, aerobic exercise and weight loss. Recommend we recheck A1c and fasting lipid panel in 3 months

## 2017-11-03 DIAGNOSIS — J0100 Acute maxillary sinusitis, unspecified: Secondary | ICD-10-CM | POA: Diagnosis not present

## 2017-12-04 DIAGNOSIS — Z961 Presence of intraocular lens: Secondary | ICD-10-CM | POA: Diagnosis not present

## 2017-12-04 DIAGNOSIS — H401133 Primary open-angle glaucoma, bilateral, severe stage: Secondary | ICD-10-CM | POA: Diagnosis not present

## 2018-01-18 DIAGNOSIS — J0100 Acute maxillary sinusitis, unspecified: Secondary | ICD-10-CM | POA: Diagnosis not present

## 2018-02-16 DIAGNOSIS — R7303 Prediabetes: Secondary | ICD-10-CM | POA: Diagnosis not present

## 2018-02-16 DIAGNOSIS — Z6827 Body mass index (BMI) 27.0-27.9, adult: Secondary | ICD-10-CM | POA: Diagnosis not present

## 2018-02-16 DIAGNOSIS — E781 Pure hyperglyceridemia: Secondary | ICD-10-CM | POA: Diagnosis not present

## 2018-02-16 DIAGNOSIS — E663 Overweight: Secondary | ICD-10-CM | POA: Diagnosis not present

## 2018-02-16 DIAGNOSIS — J019 Acute sinusitis, unspecified: Secondary | ICD-10-CM | POA: Diagnosis not present

## 2018-05-10 DIAGNOSIS — R1013 Epigastric pain: Secondary | ICD-10-CM | POA: Diagnosis not present

## 2018-06-10 ENCOUNTER — Encounter: Payer: Self-pay | Admitting: Physician Assistant

## 2018-06-10 ENCOUNTER — Ambulatory Visit (INDEPENDENT_AMBULATORY_CARE_PROVIDER_SITE_OTHER): Payer: BLUE CROSS/BLUE SHIELD | Admitting: Physician Assistant

## 2018-06-10 VITALS — BP 136/79 | HR 58 | Wt 173.0 lb

## 2018-06-10 DIAGNOSIS — M75101 Unspecified rotator cuff tear or rupture of right shoulder, not specified as traumatic: Secondary | ICD-10-CM | POA: Insufficient documentation

## 2018-06-10 DIAGNOSIS — M25511 Pain in right shoulder: Secondary | ICD-10-CM | POA: Diagnosis not present

## 2018-06-10 DIAGNOSIS — M7541 Impingement syndrome of right shoulder: Secondary | ICD-10-CM

## 2018-06-10 NOTE — Assessment & Plan Note (Addendum)
Subacromial bursa injection. Rotator cuff appeared intact. Explained the pathophysiology of rotator cuff dysfunction and the importance of doing the rehabilitation exercises.  Return to see me in 1 month.

## 2018-06-10 NOTE — Progress Notes (Signed)
HPI:                                                                Caroline Silva is a 62 y.o. female who presents to [facility] today for R arm pain  Reports for the last 2 weeks she has had persistent pain in her right proximal arm in the deltoid area. Pain wakes her from sleep at night and she has to avoid laying on that side. Pain is worse with flexion and external rotation. Denies neck pain, paresthesias, weakness.    No flowsheet data found.    Past Medical History:  Diagnosis Date  . Cataract   . Glaucoma   . Heart murmur   . Hypertriglyceridemia without hypercholesterolemia 02/17/2017  . Obesity   . Prediabetes 06/30/2012   Past Surgical History:  Procedure Laterality Date  . GLAUCOMA SURGERY     Social History   Tobacco Use  . Smoking status: Never Smoker  . Smokeless tobacco: Never Used  Substance Use Topics  . Alcohol use: No   family history includes Diabetes in her brother.    ROS: negative except as noted in the HPI  Medications: Current Outpatient Medications  Medication Sig Dispense Refill  . Multiple Vitamins-Minerals (MULTIVITAMIN ADULT PO) Take by mouth.    Marland Kitchen omeprazole (PRILOSEC) 40 MG capsule Take 1 capsule (40 mg total) by mouth daily. 30 capsule 2   No current facility-administered medications for this visit.    No Known Allergies     Objective:  BP 136/79   Pulse (!) 58   Wt 173 lb (78.5 kg)   BMI 27.92 kg/m  Gen:  alert, not ill-appearing, no distress, appropriate for age 32: head normocephalic without obvious abnormality, conjunctiva and cornea clear, trachea midline Pulm: Normal work of breathing, normal phonation Neuro: alert and oriented x 3, no tremor MSK: extremities atraumatic, normal gait and station R shoulder: atraumatic, no a/c joint tenderness, full active ROM, positive Hawkin's, negative Neer's, negative lift-off Skin: intact, no rashes on exposed skin, no  jaundice, no cyanosis   No results found for this or any previous visit (from the past 72 hour(s)). No results found.    Assessment and Plan: 62 y.o. female with   Acute R shoulder pain Atraumatic R shoulder pain localized over the deltoid with positive Hawkin's test. Suspected rotator cuff pathology and contacted sports medicine for assessment and possible intra-articular steroid injection (see note)    Patient education and anticipatory guidance given Patient agrees with treatment plan Follow-up with Sports Medicine in 1 month or sooner as needed if symptoms worsen or fail to improve  Darlyne Russian PA-C

## 2018-06-10 NOTE — Progress Notes (Signed)
Subjective:    I'm seeing this patient as a consultation for: Nelson Chimes, PA-C  CC: Right shoulder pain  HPI: This is a pleasant 62 year old female, I treated her left shoulder, she had glenohumeral joint dysfunction about 2-1/2 years ago, did extremely well, persistent and continuous relief of her left shoulder pain.  More recently she is developed pain on the right shoulder, over the deltoid, worse with overhead activities, moderate, persistent without radiation, no trauma, no mechanical symptoms.  Oral NSAIDs have not been effective up to this point.  I am called for further evaluation and definitive treatment.  I reviewed the past medical history, family history, social history, surgical history, and allergies today and no changes were needed.  Please see the problem list section below in epic for further details.  Past Medical History: Past Medical History:  Diagnosis Date  . Cataract   . Glaucoma   . Heart murmur   . Hypertriglyceridemia without hypercholesterolemia 02/17/2017  . Obesity   . Prediabetes 06/30/2012   Past Surgical History: Past Surgical History:  Procedure Laterality Date  . GLAUCOMA SURGERY     Social History: Social History   Socioeconomic History  . Marital status: Single    Spouse name: Not on file  . Number of children: Not on file  . Years of education: Not on file  . Highest education level: Not on file  Occupational History  . Not on file  Social Needs  . Financial resource strain: Not on file  . Food insecurity:    Worry: Not on file    Inability: Not on file  . Transportation needs:    Medical: Not on file    Non-medical: Not on file  Tobacco Use  . Smoking status: Never Smoker  . Smokeless tobacco: Never Used  Substance and Sexual Activity  . Alcohol use: No  . Drug use: No  . Sexual activity: Yes  Lifestyle  . Physical activity:    Days per week: Not on file    Minutes per session: Not on file  . Stress: Not on file    Relationships  . Social connections:    Talks on phone: Not on file    Gets together: Not on file    Attends religious service: Not on file    Active member of club or organization: Not on file    Attends meetings of clubs or organizations: Not on file    Relationship status: Not on file  Other Topics Concern  . Not on file  Social History Narrative  . Not on file   Family History: Family History  Problem Relation Age of Onset  . Diabetes Brother   . Cancer Neg Hx   . Heart attack Neg Hx   . Stroke Neg Hx    Allergies: No Known Allergies Medications: See med rec.  Review of Systems: No headache, visual changes, nausea, vomiting, diarrhea, constipation, dizziness, abdominal pain, skin rash, fevers, chills, night sweats, weight loss, swollen lymph nodes, body aches, joint swelling, muscle aches, chest pain, shortness of breath, mood changes, visual or auditory hallucinations.   Objective:   General: Well Developed, well nourished, and in no acute distress.  Neuro:  Extra-ocular muscles intact, able to move all 4 extremities, sensation grossly intact.  Deep tendon reflexes tested were normal. Psych: Alert and oriented, mood congruent with affect. ENT:  Ears and nose appear unremarkable.  Hearing grossly normal. Neck: Unremarkable overall appearance, trachea midline.  No visible thyroid enlargement. Eyes:  Conjunctivae and lids appear unremarkable.  Pupils equal and round. Skin: Warm and dry, no rashes noted.  Cardiovascular: Pulses palpable, no extremity edema. Right shoulder: Inspection reveals no abnormalities, atrophy or asymmetry. Palpation is normal with no tenderness over AC joint or bicipital groove. ROM is full in all planes. Rotator cuff strength normal throughout. Positive Neer and Hawkin's tests, empty can. Speeds and Yergason's tests normal. No labral pathology noted with negative Obrien's, negative crank, negative clunk, and good stability. Normal scapular  function observed. No painful arc and no drop arm sign. No apprehension sign  Procedure: Real-time Ultrasound Guided Injection of right sub-acromial bursa Device: GE Logiq E  Verbal informed consent obtained.  Time-out conducted.  Noted no overlying erythema, induration, or other signs of local infection.  Skin prepped in a sterile fashion.  Local anesthesia: Topical Ethyl chloride.  With sterile technique and under real time ultrasound guidance: 1 cc kenalog 40, 2 cc lidocaine, 2 cc bupivacaine injected easily. Completed without difficulty  Pain immediately resolved suggesting accurate placement of the medication.  Advised to call if fevers/chills, erythema, induration, drainage, or persistent bleeding.  Images permanently stored and available for review in the ultrasound unit.  Impression: Technically successful ultrasound guided injection.  Impression and Recommendations:   This case required medical decision making of moderate complexity.  Impingement syndrome of right shoulder Subacromial bursa injection. Rotator cuff appeared intact. Explained the pathophysiology of rotator cuff dysfunction and the importance of doing the rehabilitation exercises.  Return to see me in 1 month.  ___________________________________________ Gwen Her. Dianah Field, M.D., ABFM., CAQSM. Primary Care and [place] Instructor of [place] of [facility] of Medicine

## 2018-06-25 DIAGNOSIS — S46911A Strain of unspecified muscle, fascia and tendon at shoulder and upper arm level, right arm, initial encounter: Secondary | ICD-10-CM | POA: Diagnosis not present

## 2018-07-15 ENCOUNTER — Encounter: Payer: Self-pay | Admitting: Sports Medicine

## 2018-07-15 ENCOUNTER — Ambulatory Visit (INDEPENDENT_AMBULATORY_CARE_PROVIDER_SITE_OTHER): Payer: BLUE CROSS/BLUE SHIELD | Admitting: Sports Medicine

## 2018-07-15 DIAGNOSIS — M7541 Impingement syndrome of right shoulder: Secondary | ICD-10-CM

## 2018-07-15 NOTE — Assessment & Plan Note (Signed)
Subacromial bursa injection at home rehab exercises have provided near complete relief of her symptoms, she will continue the exercises and return to see me on an as-needed basis.

## 2018-07-15 NOTE — Progress Notes (Signed)
Subjective:    CC: Follow-up  HPI: Right subacromial bursitis: Injected at the last visit, she has been doing her exercises, for the most part pain-free now.  I reviewed the past medical history, family history, social history, surgical history, and allergies today and no changes were needed.  Please see the problem list section below in epic for further details.  Past Medical History: Past Medical History:  Diagnosis Date  . Cataract   . Glaucoma   . Heart murmur   . Hypertriglyceridemia without hypercholesterolemia 02/17/2017  . Obesity   . Prediabetes 06/30/2012   Past Surgical History: Past Surgical History:  Procedure Laterality Date  . GLAUCOMA SURGERY     Social History: Social History   Socioeconomic History  . Marital status: Single    Spouse name: Not on file  . Number of children: Not on file  . Years of education: Not on file  . Highest education level: Not on file  Occupational History  . Not on file  Social Needs  . Financial resource strain: Not on file  . Food insecurity:    Worry: Not on file    Inability: Not on file  . Transportation needs:    Medical: Not on file    Non-medical: Not on file  Tobacco Use  . Smoking status: Never Smoker  . Smokeless tobacco: Never Used  Substance and Sexual Activity  . Alcohol use: No  . Drug use: No  . Sexual activity: Yes  Lifestyle  . Physical activity:    Days per week: Not on file    Minutes per session: Not on file  . Stress: Not on file  Relationships  . Social connections:    Talks on phone: Not on file    Gets together: Not on file    Attends religious service: Not on file    Active member of club or organization: Not on file    Attends meetings of clubs or organizations: Not on file    Relationship status: Not on file  Other Topics Concern  . Not on file  Social History Narrative  . Not on file   Family History: Family History  Problem Relation Age of Onset  . Diabetes Brother   .  Cancer Neg Hx   . Heart attack Neg Hx   . Stroke Neg Hx    Allergies: No Known Allergies Medications: See med rec.  Review of Systems: No fevers, chills, night sweats, weight loss, chest pain, or shortness of breath.   Objective:    General: Well Developed, well nourished, and in no acute distress.  Neuro: Alert and oriented x3, extra-ocular muscles intact, sensation grossly intact.  HEENT: Normocephalic, atraumatic, pupils equal round reactive to light, neck supple, no masses, no lymphadenopathy, thyroid nonpalpable.  Skin: Warm and dry, no rashes. Cardiac: Regular rate and rhythm, no murmurs rubs or gallops, no lower extremity edema.  Respiratory: Clear to auscultation bilaterally. Not using accessory muscles, speaking in full sentences. Right shoulder: Inspection reveals no abnormalities, atrophy or asymmetry. Palpation is normal with no tenderness over AC joint or bicipital groove. ROM is full in all planes. Rotator cuff strength normal throughout. No signs of impingement with negative Neer and Hawkin's tests, empty can. Speeds and Yergason's tests normal. No labral pathology noted with negative Obrien's, negative crank, negative clunk, and good stability. Normal scapular function observed. No painful arc and no drop arm sign. No apprehension sign  Impression and Recommendations:    Impingement syndrome of right  shoulder Subacromial bursa injection at home rehab exercises have provided near complete relief of her symptoms, she will continue the exercises and return to see me on an as-needed basis. ___________________________________________ Gwen Her. Dianah Field, M.D., ABFM., CAQSM. Primary Care and [place] Instructor of [place] of [facility] of Medicine

## 2018-08-02 DIAGNOSIS — H401133 Primary open-angle glaucoma, bilateral, severe stage: Secondary | ICD-10-CM | POA: Diagnosis not present

## 2018-08-02 DIAGNOSIS — H2511 Age-related nuclear cataract, right eye: Secondary | ICD-10-CM | POA: Diagnosis not present

## 2018-08-02 DIAGNOSIS — Z961 Presence of intraocular lens: Secondary | ICD-10-CM | POA: Diagnosis not present

## 2018-09-09 ENCOUNTER — Other Ambulatory Visit: Payer: Self-pay | Admitting: Physician Assistant

## 2018-09-09 DIAGNOSIS — Z1239 Encounter for other screening for malignant neoplasm of breast: Secondary | ICD-10-CM

## 2018-09-10 ENCOUNTER — Encounter: Payer: Self-pay | Admitting: Sports Medicine

## 2018-09-10 ENCOUNTER — Ambulatory Visit (INDEPENDENT_AMBULATORY_CARE_PROVIDER_SITE_OTHER): Payer: BLUE CROSS/BLUE SHIELD | Admitting: Sports Medicine

## 2018-09-10 DIAGNOSIS — M7541 Impingement syndrome of right shoulder: Secondary | ICD-10-CM | POA: Diagnosis not present

## 2018-09-10 NOTE — Assessment & Plan Note (Signed)
Good response to subacromial injection 3 months ago, repeat injection today, she did need two injections on her left shoulder for complete relief in the past.

## 2018-09-10 NOTE — Progress Notes (Signed)
Subjective:    CC: Right shoulder pain  HPI: This is a pleasant 62 year old female, she has a history of bilateral subacromial bursitis, the left side took 2 injections sometime ago.  The right side was injected about 3 months ago, she had good relief until recently.  Now having a recurrence of pain, moderate, persistent, localized of the deltoid and worse with abduction and overhead activities.  I reviewed the past medical history, family history, social history, surgical history, and allergies today and no changes were needed.  Please see the problem list section below in epic for further details.  Past Medical History: Past Medical History:  Diagnosis Date  . Cataract   . Glaucoma   . Heart murmur   . Hypertriglyceridemia without hypercholesterolemia 02/17/2017  . Obesity   . Prediabetes 06/30/2012   Past Surgical History: Past Surgical History:  Procedure Laterality Date  . GLAUCOMA SURGERY     Social History: Social History   Socioeconomic History  . Marital status: Single    Spouse name: Not on file  . Number of children: Not on file  . Years of education: Not on file  . Highest education level: Not on file  Occupational History  . Not on file  Social Needs  . Financial resource strain: Not on file  . Food insecurity:    Worry: Not on file    Inability: Not on file  . Transportation needs:    Medical: Not on file    Non-medical: Not on file  Tobacco Use  . Smoking status: Never Smoker  . Smokeless tobacco: Never Used  Substance and Sexual Activity  . Alcohol use: No  . Drug use: No  . Sexual activity: Yes  Lifestyle  . Physical activity:    Days per week: Not on file    Minutes per session: Not on file  . Stress: Not on file  Relationships  . Social connections:    Talks on phone: Not on file    Gets together: Not on file    Attends religious service: Not on file    Active member of club or organization: Not on file    Attends meetings of clubs or  organizations: Not on file    Relationship status: Not on file  Other Topics Concern  . Not on file  Social History Narrative  . Not on file   Family History: Family History  Problem Relation Age of Onset  . Diabetes Brother   . Cancer Neg Hx   . Heart attack Neg Hx   . Stroke Neg Hx    Allergies: No Known Allergies Medications: See med rec.  Review of Systems: No fevers, chills, night sweats, weight loss, chest pain, or shortness of breath.   Objective:    General: Well Developed, well nourished, and in no acute distress.  Neuro: Alert and oriented x3, extra-ocular muscles intact, sensation grossly intact.  HEENT: Normocephalic, atraumatic, pupils equal round reactive to light, neck supple, no masses, no lymphadenopathy, thyroid nonpalpable.  Skin: Warm and dry, no rashes. Cardiac: Regular rate and rhythm, no murmurs rubs or gallops, no lower extremity edema.  Respiratory: Clear to auscultation bilaterally. Not using accessory muscles, speaking in full sentences. Right shoulder: Inspection reveals no abnormalities, atrophy or asymmetry. Palpation is normal with no tenderness over AC joint or bicipital groove. ROM is full in all planes. Rotator cuff strength normal throughout. Positive Neer and Hawkin's tests, empty can. Speeds and Yergason's tests normal. No labral pathology noted with  negative Obrien's, negative crank, negative clunk, and good stability. Normal scapular function observed. No painful arc and no drop arm sign. No apprehension sign  Procedure: Real-time Ultrasound Guided Injection of right subacromial bursa Device: GE Logiq E  Verbal informed consent obtained.  Time-out conducted.  Noted no overlying erythema, induration, or other signs of local infection.  Skin prepped in a sterile fashion.  Local anesthesia: Topical Ethyl chloride.  With sterile technique and under real time ultrasound guidance: 1 cc Kenalog 40, 1 cc lidocaine, 1 cc bupivacaine  injected easily Completed without difficulty  Pain immediately resolved suggesting accurate placement of the medication.  Advised to call if fevers/chills, erythema, induration, drainage, or persistent bleeding.  Images permanently stored and available for review in the ultrasound unit.  Impression: Technically successful ultrasound guided injection.  Impression and Recommendations:    Impingement syndrome of right shoulder Good response to subacromial injection 3 months ago, repeat injection today, she did need two injections on her left shoulder for complete relief in the past.  ___________________________________________ Gwen Her. Dianah Field, M.D., ABFM., CAQSM. Primary Care and Sports Medicine [facility]  Adjunct Professor of [place] of [facility] of Medicine

## 2018-10-02 ENCOUNTER — Other Ambulatory Visit: Payer: Self-pay

## 2018-10-02 ENCOUNTER — Emergency Department
Admission: EM | Admit: 2018-10-02 | Discharge: 2018-10-02 | Disposition: A | Discharge status: Home / Self Care | Payer: BLUE CROSS/BLUE SHIELD | Source: Home / Self Care | Attending: Family Medicine | Admitting: Family Medicine

## 2018-10-02 ENCOUNTER — Encounter: Payer: Self-pay | Admitting: Emergency Medicine

## 2018-10-02 DIAGNOSIS — J40 Bronchitis, not specified as acute or chronic: Secondary | ICD-10-CM | POA: Diagnosis not present

## 2018-10-02 MED ORDER — DOXYCYCLINE HYCLATE 100 MG PO CAPS: 100.0000 mg | ORAL_CAPSULE | Freq: Two times a day (BID) | ORAL | 0 refills | Status: DC

## 2018-10-02 MED ORDER — BENZONATATE 200 MG PO CAPS: ORAL_CAPSULE | ORAL | 0 refills | Status: DC

## 2018-10-02 NOTE — ED Triage Notes (Signed)
Here with sinus sx's- facial pressure, pain radiating behind eyes, post nasal drip, cough and chest congestion. Started 4 dys ago- Pensions consultant, lemon tea and off brand allergy med. Chills noted;denies fever,n,v.

## 2018-10-02 NOTE — ED Provider Notes (Signed)
[name]    CSN: [id number] Arrival date & time: 10/02/18  1240     History   Chief Complaint Chief Complaint  Patient presents with  . URI    possible sinus infection    HPI Caroline Silva is a 62 y.o. female.   Six days ago patient developed typical cold-like symptoms developing over several days, including mild sore throat, sinus congestion, fatigue, and cough.  Her cough is productive and worse at night.  During the past two days patient has developed night sweats and wheezing, and tightness in her anterior chest. She has a past history of pneumonia.  The history is provided by the patient.    Past Medical History:  Diagnosis Date  . Cataract   . Glaucoma   . Heart murmur   . Hypertriglyceridemia without hypercholesterolemia 02/17/2017  . Obesity   . Prediabetes 06/30/2012    Patient Active Problem List   Diagnosis Date Noted  . Impingement syndrome of right shoulder 06/10/2018  . Dermoid cyst of scalp 10/21/2017  . Class 1 obesity due to excess calories without serious comorbidity with body mass index (BMI) of 30.0 to 30.9 in adult 02/17/2017  . Hypertriglyceridemia without hypercholesterolemia 02/17/2017  . Systolic ejection murmur 82/42/3536  . Onychomycosis 02/13/2017  . Left shoulder pain 11/06/2015  . HPV test positive 01/26/2014  . Prediabetes 06/30/2012    Past Surgical History:  Procedure Laterality Date  . GLAUCOMA SURGERY      OB History   None      Home Medications    Prior to Admission medications   Medication Sig Start Date End Date Taking? Authorizing Provider  benzonatate (TESSALON) 200 MG capsule Take one cap by mouth at bedtime as needed for cough.  May repeat in 4 to 6 hours 10/02/18   Kandra Nicolas, MD  doxycycline (VIBRAMYCIN) 100 MG capsule Take 1 capsule (100 mg total) by mouth 2 (two) times daily. Take with food. 10/02/18   Kandra Nicolas, MD  Multiple Vitamins-Minerals (MULTIVITAMIN ADULT PO) Take by  mouth.    [provider]    Family History Family History  Problem Relation Age of Onset  . Diabetes Brother   . Cancer Neg Hx   . Heart attack Neg Hx   . Stroke Neg Hx     Social History Social History   Tobacco Use  . Smoking status: Never Smoker  . Smokeless tobacco: Never Used  Substance Use Topics  . Alcohol use: No  . Drug use: No     Allergies   Patient has no known allergies.   Review of Systems Review of Systems + sore throat + cough No pleuritic pain + wheezing + nasal congestion + post-nasal drainage + sinus pain/pressure No itchy/red eyes ? earache No hemoptysis No SOB No fever, + chills/sweats No nausea No vomiting No abdominal pain No diarrhea No urinary symptoms No skin rash + fatigue No myalgias No headache Used OTC meds without relief  Physical Exam Triage Vital Signs ED Triage Vitals  Enc Vitals Group     BP 10/02/18 1300 118/70     Pulse Rate 10/02/18 1300 77     Resp --      Temp 10/02/18 1300 98.2 F (36.8 C)     Temp Source 10/02/18 1300 Oral     SpO2 10/02/18 1300 97 %     Weight 10/02/18 1301 174 lb 12.8 oz (79.3 kg)     Height 10/02/18 1301 5'  6" (1.676 m)     Head Circumference --      Peak Flow --      Pain Score 10/02/18 1300 3     Pain Loc --      Pain Edu? --      Excl. in [place]? --    No data found.  Updated Vital Signs BP 118/70 (BP Location: Right Arm)   Pulse 77   Temp 98.2 F (36.8 C) (Oral)   Ht 5\' 6"  (1.676 m)   Wt 79.3 kg   SpO2 97%   BMI 28.21 kg/m   Visual Acuity Right Eye Distance:   Left Eye Distance:   Bilateral Distance:    Right Eye Near:   Left Eye Near:    Bilateral Near:     Physical Exam Nursing notes and Vital Signs reviewed. Appearance:  Patient appears stated age, and in no acute distress Eyes:  Pupils are equal, round, and reactive to light and accomodation.  Extraocular movement is intact.  Conjunctivae are not inflamed  Ears:  Canals normal.  Tympanic  membranes normal.  Nose:  Mildly congested turbinates.  No sinus tenderness.   Pharynx:  Normal Neck:  Supple.  Enlarged posterior/lateral nodes are palpated bilaterally, tender to palpation on the left.   Lungs:  Clear to auscultation.  Breath sounds are equal.  Moving air well. Heart:  Regular rate and rhythm without murmurs, rubs, or gallops.  Abdomen:  Nontender without masses or hepatosplenomegaly.  Bowel sounds are present.  No CVA or flank tenderness.  Extremities:  No edema.  Skin:  No rash present.    UC Treatments / Results  Labs (all labs ordered are listed, but only abnormal results are displayed) Labs Reviewed - No data to display  EKG None  Radiology No results found.  Procedures Procedures (including critical care time)  Medications Ordered in UC Medications - No data to display  Initial Impression / Assessment and Plan / UC Course  I have reviewed the triage vital signs and the nursing notes.  Pertinent labs & imaging results that were available during my care of the patient were reviewed by me and considered in my medical decision making (see chart for details).    Patient's onset of night sweats may represent bacterial bronchitis. Begin empiric doxycycline 100mg  BID.  Prescription written for Benzonatate [facility]) to take at bedtime for night-time cough.  Followup with Family Doctor if not improved in 7 to 10 days.   Final Clinical Impressions(s) / UC Diagnoses   Final diagnoses:  Bronchitis     Discharge Instructions     Take plain guaifenesin (1200mg  extended release tabs such as Mucinex) twice daily, with plenty of water, for cough and congestion.  May add Pseudoephedrine (30mg , one or two every 4 to 6 hours) for sinus congestion.  Get adequate rest.   Try warm salt water gargles for sore throat.  Stop all antihistamines for now, and other non-prescription cough/cold preparations.      ED Prescriptions    Medication Sig Dispense Auth.  Provider   doxycycline (VIBRAMYCIN) 100 MG capsule Take 1 capsule (100 mg total) by mouth 2 (two) times daily. Take with food. 14 capsule Kandra Nicolas, MD   benzonatate (TESSALON) 200 MG capsule Take one cap by mouth at bedtime as needed for cough.  May repeat in 4 to 6 hours 15 capsule Assunta Found Ishmael Holter, MD         Kandra Nicolas, MD 10/02/18 [phone]

## 2018-10-02 NOTE — Discharge Instructions (Addendum)
Take plain guaifenesin (1200mg  extended release tabs such as Mucinex) twice daily, with plenty of water, for cough and congestion.  May add Pseudoephedrine (30mg , one or two every 4 to 6 hours) for sinus congestion.  Get adequate rest.   Try warm salt water gargles for sore throat.  Stop all antihistamines for now, and other non-prescription cough/cold preparations.

## 2018-10-08 ENCOUNTER — Ambulatory Visit: Payer: BLUE CROSS/BLUE SHIELD | Admitting: Sports Medicine

## 2018-10-22 ENCOUNTER — Ambulatory Visit (INDEPENDENT_AMBULATORY_CARE_PROVIDER_SITE_OTHER): Payer: BLUE CROSS/BLUE SHIELD

## 2018-10-22 DIAGNOSIS — Z1239 Encounter for other screening for malignant neoplasm of breast: Secondary | ICD-10-CM

## 2018-10-22 DIAGNOSIS — Z1231 Encounter for screening mammogram for malignant neoplasm of breast: Secondary | ICD-10-CM

## 2018-11-03 DIAGNOSIS — Z961 Presence of intraocular lens: Secondary | ICD-10-CM | POA: Diagnosis not present

## 2018-11-03 DIAGNOSIS — H401133 Primary open-angle glaucoma, bilateral, severe stage: Secondary | ICD-10-CM | POA: Diagnosis not present

## 2018-12-15 DIAGNOSIS — H401133 Primary open-angle glaucoma, bilateral, severe stage: Secondary | ICD-10-CM | POA: Diagnosis not present

## 2018-12-15 DIAGNOSIS — Z6828 Body mass index (BMI) 28.0-28.9, adult: Secondary | ICD-10-CM | POA: Diagnosis not present

## 2018-12-15 DIAGNOSIS — H2511 Age-related nuclear cataract, right eye: Secondary | ICD-10-CM | POA: Diagnosis not present

## 2018-12-15 DIAGNOSIS — K210 Gastro-esophageal reflux disease with esophagitis: Secondary | ICD-10-CM | POA: Diagnosis not present

## 2018-12-15 DIAGNOSIS — K219 Gastro-esophageal reflux disease without esophagitis: Secondary | ICD-10-CM | POA: Diagnosis not present

## 2018-12-15 DIAGNOSIS — Z961 Presence of intraocular lens: Secondary | ICD-10-CM | POA: Diagnosis not present

## 2018-12-15 DIAGNOSIS — E663 Overweight: Secondary | ICD-10-CM | POA: Diagnosis not present

## 2019-01-02 DIAGNOSIS — Z79899 Other long term (current) drug therapy: Secondary | ICD-10-CM | POA: Diagnosis not present

## 2019-01-02 DIAGNOSIS — R079 Chest pain, unspecified: Secondary | ICD-10-CM | POA: Diagnosis not present

## 2019-01-02 DIAGNOSIS — R0789 Other chest pain: Secondary | ICD-10-CM | POA: Diagnosis not present

## 2019-01-02 DIAGNOSIS — R001 Bradycardia, unspecified: Secondary | ICD-10-CM | POA: Diagnosis not present

## 2019-01-02 DIAGNOSIS — R110 Nausea: Secondary | ICD-10-CM | POA: Diagnosis not present

## 2019-01-02 DIAGNOSIS — R51 Headache: Secondary | ICD-10-CM | POA: Diagnosis not present

## 2019-01-03 DIAGNOSIS — E663 Overweight: Secondary | ICD-10-CM | POA: Diagnosis not present

## 2019-01-03 DIAGNOSIS — K210 Gastro-esophageal reflux disease with esophagitis: Secondary | ICD-10-CM | POA: Diagnosis not present

## 2019-01-03 DIAGNOSIS — Z6827 Body mass index (BMI) 27.0-27.9, adult: Secondary | ICD-10-CM | POA: Diagnosis not present

## 2019-01-03 DIAGNOSIS — K2900 Acute gastritis without bleeding: Secondary | ICD-10-CM | POA: Diagnosis not present

## 2019-01-03 DIAGNOSIS — B9681 Helicobacter pylori [H. pylori] as the cause of diseases classified elsewhere: Secondary | ICD-10-CM | POA: Diagnosis not present

## 2019-01-27 DIAGNOSIS — H401133 Primary open-angle glaucoma, bilateral, severe stage: Secondary | ICD-10-CM | POA: Diagnosis not present

## 2019-01-27 DIAGNOSIS — Z961 Presence of intraocular lens: Secondary | ICD-10-CM | POA: Diagnosis not present

## 2019-01-28 ENCOUNTER — Other Ambulatory Visit: Payer: Self-pay

## 2019-01-28 ENCOUNTER — Ambulatory Visit (INDEPENDENT_AMBULATORY_CARE_PROVIDER_SITE_OTHER): Payer: BLUE CROSS/BLUE SHIELD | Admitting: Sports Medicine

## 2019-01-28 ENCOUNTER — Other Ambulatory Visit: Payer: Self-pay | Admitting: Sports Medicine

## 2019-01-28 DIAGNOSIS — M25511 Pain in right shoulder: Secondary | ICD-10-CM

## 2019-01-28 DIAGNOSIS — G8929 Other chronic pain: Secondary | ICD-10-CM | POA: Diagnosis not present

## 2019-01-28 DIAGNOSIS — M7541 Impingement syndrome of right shoulder: Secondary | ICD-10-CM | POA: Diagnosis not present

## 2019-01-28 DIAGNOSIS — S46011A Strain of muscle(s) and tendon(s) of the rotator cuff of right shoulder, initial encounter: Secondary | ICD-10-CM

## 2019-01-28 MED ORDER — TRAMADOL HCL 50 MG PO TABS: 50.0000 mg | ORAL_TABLET | Freq: Three times a day (TID) | ORAL | 0 refills | Status: DC | PRN

## 2019-01-28 NOTE — Progress Notes (Signed)
Subjective:    CC: Right shoulder pain  HPI: This is a pleasant 63 year old female, she has a history of impingement syndrome of the right shoulder, her last injection was done back in November 2019.  2 weeks ago she was trying to lift a heavy object, felt a pulling/tearing pain in her right shoulder that was severe.  Pain was localized over the deltoid, she had persistent discomfort without improvement over the past 2 weeks, and inability to fully abduct her shoulder.  I reviewed the past medical history, family history, social history, surgical history, and allergies today and no changes were needed.  Please see the problem list section below in epic for further details.  Past Medical History: Past Medical History:  Diagnosis Date  . Cataract   . Glaucoma   . Heart murmur   . Hypertriglyceridemia without hypercholesterolemia 02/17/2017  . Obesity   . Prediabetes 06/30/2012   Past Surgical History: Past Surgical History:  Procedure Laterality Date  . GLAUCOMA SURGERY     Social History: Social History   Socioeconomic History  . Marital status: Single    Spouse name: Not on file  . Number of children: Not on file  . Years of education: Not on file  . Highest education level: Not on file  Occupational History  . Not on file  Social Needs  . Financial resource strain: Not on file  . Food insecurity:    Worry: Not on file    Inability: Not on file  . Transportation needs:    Medical: Not on file    Non-medical: Not on file  Tobacco Use  . Smoking status: Never Smoker  . Smokeless tobacco: Never Used  Substance and Sexual Activity  . Alcohol use: No  . Drug use: No  . Sexual activity: Yes  Lifestyle  . Physical activity:    Days per week: Not on file    Minutes per session: Not on file  . Stress: Not on file  Relationships  . Social connections:    Talks on phone: Not on file    Gets together: Not on file    Attends religious service: Not on file    Active  member of club or organization: Not on file    Attends meetings of clubs or organizations: Not on file    Relationship status: Not on file  Other Topics Concern  . Not on file  Social History Narrative  . Not on file   Family History: Family History  Problem Relation Age of Onset  . Diabetes Brother   . Cancer Neg Hx   . Heart attack Neg Hx   . Stroke Neg Hx    Allergies: No Known Allergies Medications: See med rec.  Review of Systems: No fevers, chills, night sweats, weight loss, chest pain, or shortness of breath.   Objective:    General: Well Developed, well nourished, and in no acute distress.  Neuro: Alert and oriented x3, extra-ocular muscles intact, sensation grossly intact.  HEENT: Normocephalic, atraumatic, pupils equal round reactive to light, neck supple, no masses, no lymphadenopathy, thyroid nonpalpable.  Skin: Warm and dry, no rashes. Cardiac: Regular rate and rhythm, no murmurs rubs or gallops, no lower extremity edema.  Respiratory: Clear to auscultation bilaterally. Not using accessory muscles, speaking in full sentences. Right shoulder: Inspection reveals no abnormalities, atrophy or asymmetry. Palpation is normal with no tenderness over AC joint or bicipital groove. ROM is full in all planes. Rotator cuff strength very weak to  abduction Positive for Neer and Hawkin's tests, empty can. Speeds and Yergason's tests normal. No labral pathology noted with negative Obrien's, negative crank, negative clunk, and good stability. Normal scapular function observed. Painful arc and drop arm sign. No apprehension sign  Procedure: Diagnostic Ultrasound of right shoulder Device: GE Logiq E  Findings: Noted near full-thickness, full width retracted tear of the supraspinatus with only a few fibers hanging on. Images permanently stored and available for review in the ultrasound unit.  Impression: Essentially full-thickness, full width retracted supraspinatus tear.   Impression and Recommendations:    Rotator cuff tear, right Persistent pain, the last injection was in November 2019. 2 weeks ago she tried to lift a heavy object and felt sharp pain in her right shoulder. Difficulty abducting, ultrasound shows a near full-thickness, full width retracted tear of the supraspinatus with only a few fibers still intact. Retracted rotator cuff tears need to be repaired urgently. Adding MRI, referral to Dr. Griffin Basil. Tramadol for pain. No injections today.   ___________________________________________ Gwen Her. Dianah Field, M.D., ABFM., CAQSM. Primary Care and Sports Medicine [facility]  Adjunct Professor of [place] [employer] [facility] [employer]

## 2019-01-28 NOTE — Assessment & Plan Note (Signed)
Persistent pain, the last injection was in November 2019. 2 weeks ago she tried to lift a heavy object and felt sharp pain in her right shoulder. Difficulty abducting, ultrasound shows a near full-thickness, full width retracted tear of the supraspinatus with only a few fibers still intact. Retracted rotator cuff tears need to be repaired urgently. Adding MRI, referral to Dr. Griffin Basil. Tramadol for pain. No injections today.

## 2019-02-07 ENCOUNTER — Ambulatory Visit: Payer: BLUE CROSS/BLUE SHIELD

## 2019-02-07 ENCOUNTER — Other Ambulatory Visit: Payer: Self-pay

## 2019-02-08 ENCOUNTER — Telehealth: Payer: Self-pay | Admitting: Sports Medicine

## 2019-02-08 MED ORDER — DIAZEPAM 5 MG PO TABS: ORAL_TABLET | ORAL | 0 refills | Status: DC

## 2019-02-08 NOTE — Telephone Encounter (Signed)
Attempted to call again, no ans. Will try again later

## 2019-02-08 NOTE — Telephone Encounter (Signed)
Caroline Silva - can you call Pt to advise? She speaks spanish. Thank you!

## 2019-02-08 NOTE — Telephone Encounter (Signed)
Lawd.Marland KitchenMarland KitchenSending in Valium, she will need a driver.

## 2019-02-08 NOTE — Telephone Encounter (Signed)
Pt attempted to complete MRI yesterday, but was very claustrophobic. She will need premeds before trying again. Please review.

## 2019-02-08 NOTE — Telephone Encounter (Signed)
Attempted to call, no answer. Will try again later

## 2019-02-09 DIAGNOSIS — M19011 Primary osteoarthritis, right shoulder: Secondary | ICD-10-CM | POA: Diagnosis not present

## 2019-02-09 DIAGNOSIS — K210 Gastro-esophageal reflux disease with esophagitis: Secondary | ICD-10-CM | POA: Diagnosis not present

## 2019-02-09 NOTE — Telephone Encounter (Signed)
Pt advised of medication and advised that she will need a driver when getting MRI. No questions or concerns at this time

## 2019-02-28 DIAGNOSIS — Z961 Presence of intraocular lens: Secondary | ICD-10-CM | POA: Diagnosis not present

## 2019-02-28 DIAGNOSIS — H401133 Primary open-angle glaucoma, bilateral, severe stage: Secondary | ICD-10-CM | POA: Diagnosis not present

## 2019-02-28 DIAGNOSIS — H2511 Age-related nuclear cataract, right eye: Secondary | ICD-10-CM | POA: Diagnosis not present

## 2019-03-30 DIAGNOSIS — H2511 Age-related nuclear cataract, right eye: Secondary | ICD-10-CM | POA: Diagnosis not present

## 2019-03-30 DIAGNOSIS — H401133 Primary open-angle glaucoma, bilateral, severe stage: Secondary | ICD-10-CM | POA: Diagnosis not present

## 2019-03-30 DIAGNOSIS — Z961 Presence of intraocular lens: Secondary | ICD-10-CM | POA: Diagnosis not present

## 2019-04-11 DIAGNOSIS — M25511 Pain in right shoulder: Secondary | ICD-10-CM | POA: Diagnosis not present

## 2019-04-11 DIAGNOSIS — F411 Generalized anxiety disorder: Secondary | ICD-10-CM | POA: Diagnosis not present

## 2019-04-11 DIAGNOSIS — K210 Gastro-esophageal reflux disease with esophagitis: Secondary | ICD-10-CM | POA: Diagnosis not present

## 2019-04-11 DIAGNOSIS — K219 Gastro-esophageal reflux disease without esophagitis: Secondary | ICD-10-CM | POA: Diagnosis not present

## 2019-04-20 DIAGNOSIS — M25511 Pain in right shoulder: Secondary | ICD-10-CM | POA: Diagnosis not present

## 2019-04-20 DIAGNOSIS — Z79899 Other long term (current) drug therapy: Secondary | ICD-10-CM | POA: Diagnosis not present

## 2019-05-06 DIAGNOSIS — R29898 Other symptoms and signs involving the musculoskeletal system: Secondary | ICD-10-CM | POA: Diagnosis not present

## 2019-05-06 DIAGNOSIS — M25611 Stiffness of right shoulder, not elsewhere classified: Secondary | ICD-10-CM | POA: Diagnosis not present

## 2019-05-06 DIAGNOSIS — M25511 Pain in right shoulder: Secondary | ICD-10-CM | POA: Diagnosis not present

## 2019-05-09 DIAGNOSIS — Z961 Presence of intraocular lens: Secondary | ICD-10-CM | POA: Diagnosis not present

## 2019-05-09 DIAGNOSIS — H401133 Primary open-angle glaucoma, bilateral, severe stage: Secondary | ICD-10-CM | POA: Diagnosis not present

## 2019-05-09 DIAGNOSIS — H2511 Age-related nuclear cataract, right eye: Secondary | ICD-10-CM | POA: Diagnosis not present

## 2019-05-10 DIAGNOSIS — M25511 Pain in right shoulder: Secondary | ICD-10-CM | POA: Diagnosis not present

## 2019-05-10 DIAGNOSIS — M25611 Stiffness of right shoulder, not elsewhere classified: Secondary | ICD-10-CM | POA: Diagnosis not present

## 2019-05-10 DIAGNOSIS — R29898 Other symptoms and signs involving the musculoskeletal system: Secondary | ICD-10-CM | POA: Diagnosis not present

## 2019-05-19 DIAGNOSIS — R29898 Other symptoms and signs involving the musculoskeletal system: Secondary | ICD-10-CM | POA: Diagnosis not present

## 2019-05-19 DIAGNOSIS — M25611 Stiffness of right shoulder, not elsewhere classified: Secondary | ICD-10-CM | POA: Diagnosis not present

## 2019-05-19 DIAGNOSIS — M25511 Pain in right shoulder: Secondary | ICD-10-CM | POA: Diagnosis not present

## 2019-06-01 DIAGNOSIS — M25611 Stiffness of right shoulder, not elsewhere classified: Secondary | ICD-10-CM | POA: Diagnosis not present

## 2019-06-01 DIAGNOSIS — M25511 Pain in right shoulder: Secondary | ICD-10-CM | POA: Diagnosis not present

## 2019-06-01 DIAGNOSIS — R29898 Other symptoms and signs involving the musculoskeletal system: Secondary | ICD-10-CM | POA: Diagnosis not present

## 2019-06-07 ENCOUNTER — Other Ambulatory Visit: Payer: Self-pay

## 2019-06-07 ENCOUNTER — Encounter: Payer: Self-pay | Admitting: Sports Medicine

## 2019-06-07 ENCOUNTER — Ambulatory Visit (INDEPENDENT_AMBULATORY_CARE_PROVIDER_SITE_OTHER): Payer: BC Managed Care – PPO | Admitting: Sports Medicine

## 2019-06-07 DIAGNOSIS — G8929 Other chronic pain: Secondary | ICD-10-CM | POA: Diagnosis not present

## 2019-06-07 DIAGNOSIS — M25511 Pain in right shoulder: Secondary | ICD-10-CM | POA: Diagnosis not present

## 2019-06-07 DIAGNOSIS — S46011S Strain of muscle(s) and tendon(s) of the rotator cuff of right shoulder, sequela: Secondary | ICD-10-CM | POA: Diagnosis not present

## 2019-06-07 MED ORDER — DIAZEPAM 5 MG PO TABS: ORAL_TABLET | ORAL | 0 refills | Status: DC

## 2019-06-07 NOTE — Progress Notes (Addendum)
Subjective:    CC: Right shoulder pain  HPI: This is a 63 year old female, I saw her back in April and we diagnosed her with a full-thickness supraspinatus tear.  After she failed injections and therapy we had recommended an MRI and operative intervention.  She never got the MRI and went to another orthopedic surgeon who performed another injection which not surprisingly was not effective.  She went through some more physical therapy also which was not effective.  She is back here for further evaluation.  I reviewed the past medical history, family history, social history, surgical history, and allergies today and no changes were needed.  Please see the problem list section below in epic for further details.  Past Medical History: Past Medical History:  Diagnosis Date  . Cataract   . Glaucoma   . Heart murmur   . Hypertriglyceridemia without hypercholesterolemia 02/17/2017  . Obesity   . Prediabetes 06/30/2012   Past Surgical History: Past Surgical History:  Procedure Laterality Date  . GLAUCOMA SURGERY     Social History: Social History   Socioeconomic History  . Marital status: Single    Spouse name: Not on file  . Number of children: Not on file  . Years of education: Not on file  . Highest education level: Not on file  Occupational History  . Not on file  Social Needs  . Financial resource strain: Not on file  . Food insecurity    Worry: Not on file    Inability: Not on file  . Transportation needs    Medical: Not on file    Non-medical: Not on file  Tobacco Use  . Smoking status: Never Smoker  . Smokeless tobacco: Never Used  Substance and Sexual Activity  . Alcohol use: No  . Drug use: No  . Sexual activity: Yes  Lifestyle  . Physical activity    Days per week: Not on file    Minutes per session: Not on file  . Stress: Not on file  Relationships  . Social Herbalist on phone: Not on file    Gets together: Not on file    Attends religious  service: Not on file    Active member of club or organization: Not on file    Attends meetings of clubs or organizations: Not on file    Relationship status: Not on file  Other Topics Concern  . Not on file  Social History Narrative  . Not on file   Family History: Family History  Problem Relation Age of Onset  . Diabetes Brother   . Cancer Neg Hx   . Heart attack Neg Hx   . Stroke Neg Hx    Allergies: No Known Allergies Medications: See med rec.  Review of Systems: No fevers, chills, night sweats, weight loss, chest pain, or shortness of breath.   Objective:    General: Well Developed, well nourished, and in no acute distress.  Neuro: Alert and oriented x3, extra-ocular muscles intact, sensation grossly intact.  HEENT: Normocephalic, atraumatic, pupils equal round reactive to light, neck supple, no masses, no lymphadenopathy, thyroid nonpalpable.  Skin: Warm and dry, no rashes. Cardiac: Regular rate and rhythm, no murmurs rubs or gallops, no lower extremity edema.  Respiratory: Clear to auscultation bilaterally. Not using accessory muscles, speaking in full sentences. Right shoulder: Inspection reveals no abnormalities, atrophy or asymmetry. Palpation is normal with no tenderness over AC joint or bicipital groove. ROM is full in all planes. Rotator cuff  strength weak to abduction Positive Neer and Hawkin's tests, empty can. Speeds and Yergason's tests normal. No labral pathology noted with negative Obrien's, negative crank, negative clunk, and good stability. Normal scapular function observed. No painful arc and no drop arm sign. No apprehension sign  Impression and Recommendations:    Rotator cuff tear, right We last injected her in November 2019, ultrasound showed a near full-thickness full width retracted tear of the supraspinatus with only a few fibers intact. She never got the MRI as was ordered back in April. I referred her to Dr. Griffin Basil, she ended up going to  [facility] orthopedic surgery, they also injected her and ordered an MRI, she still did not do it. I informed her today that there was nothing that we could do nonoperative, and that we would need an MRI in anticipation of surgery she got significantly claustrophobic so we are going to add some Valium to take beforehand. Return to see me to go over MRI results.  Patient was unable to tolerate MRI even with a couple of Valium on board so we are going to proceed with CT arthrography of the right shoulder, she can come to see me for the arthrogram injection and then go downstairs for a CT.   ___________________________________________ Gwen Her. Dianah Field, M.D., ABFM., CAQSM. Primary Care and Sports Medicine [facility]  Adjunct Professor of [place] [employer] [facility] [employer] Medicine

## 2019-06-07 NOTE — Assessment & Plan Note (Addendum)
We last injected her in November 2019, ultrasound showed a near full-thickness full width retracted tear of the supraspinatus with only a few fibers intact. She never got the MRI as was ordered back in April. I referred her to Dr. Griffin Basil, she ended up going to [facility] orthopedic surgery, they also injected her and ordered an MRI, she still did not do it. I informed her today that there was nothing that we could do nonoperative, and that we would need an MRI in anticipation of surgery she got significantly claustrophobic so we are going to add some Valium to take beforehand. Return to see me to go over MRI results.  Patient was unable to tolerate MRI even with a couple of Valium on board so we are going to proceed with CT arthrography of the right shoulder, she can come to see me for the arthrogram injection and then go downstairs for a CT.

## 2019-06-12 ENCOUNTER — Ambulatory Visit: Payer: BC Managed Care – PPO

## 2019-06-12 ENCOUNTER — Other Ambulatory Visit: Payer: Self-pay

## 2019-06-14 NOTE — Addendum Note (Signed)
Addended by: Silverio Decamp on: 06/14/2019 11:19 AM   Modules accepted: Orders

## 2019-06-16 ENCOUNTER — Other Ambulatory Visit: Payer: Self-pay

## 2019-06-16 ENCOUNTER — Ambulatory Visit (INDEPENDENT_AMBULATORY_CARE_PROVIDER_SITE_OTHER): Payer: BC Managed Care – PPO | Admitting: Sports Medicine

## 2019-06-16 ENCOUNTER — Ambulatory Visit (INDEPENDENT_AMBULATORY_CARE_PROVIDER_SITE_OTHER): Payer: BC Managed Care – PPO

## 2019-06-16 DIAGNOSIS — S46011S Strain of muscle(s) and tendon(s) of the rotator cuff of right shoulder, sequela: Secondary | ICD-10-CM

## 2019-06-16 DIAGNOSIS — M25511 Pain in right shoulder: Secondary | ICD-10-CM | POA: Diagnosis not present

## 2019-06-16 DIAGNOSIS — G8929 Other chronic pain: Secondary | ICD-10-CM

## 2019-06-16 DIAGNOSIS — M75121 Complete rotator cuff tear or rupture of right shoulder, not specified as traumatic: Secondary | ICD-10-CM | POA: Diagnosis not present

## 2019-06-16 NOTE — Progress Notes (Signed)
   Procedure: Real-time Ultrasound Guided Isovue contrast injection of right glenohumeral joint Device: GE Logiq E  Verbal informed consent obtained.  Time-out conducted.  Noted no overlying erythema, induration, or other signs of local infection.  Skin prepped in a sterile fashion.  Local anesthesia: Topical Ethyl chloride.  With sterile technique and under real time ultrasound guidance: I used 5 cc of subcutaneous lidocaine, I then used an 18-gauge spinal needle to advanced to the glenohumeral joint from a posterior approach, initially I injected 2 cc lidocaine, 2 cc bupivacaine, 1 cc Kenalog 40 into the joint, syringe switched and 15 cc Isovue was injected, I then chased this with 20 mL of sterile saline. Joint visualized and capsule seen distending confirming intra-articular placement of contrast material and medication. Completed without difficulty  Advised to call if fevers/chills, erythema, induration, drainage, or persistent bleeding.  Images permanently stored and available for review in the ultrasound unit.  Impression: Technically successful ultrasound guided gadolinium contrast injection for CT arthrography.  Please see separate CT arthrogram report.  _________________________________________________________________________________________  Rotator cuff tear, right We last injected her in November 2019, ultrasound showed a near full-thickness full width retracted tear of the supraspinatus with only a few fibers intact. She never got the MRI as was ordered back in April. I referred her to Dr. Griffin Basil, she ended up going to [facility] orthopedic surgery, they also injected her and ordered an MRI, she still did not do it. I informed her today that there was nothing that we could do nonoperative, and that we would need an MRI in anticipation of surgery she got significantly claustrophobic so we are going to add some Valium to take beforehand. Return to see me to go over MRI results.   Patient was unable to tolerate MRI even with a couple of Valium on board so we are going to proceed with CT arthrography of the right shoulder, she can come to see me for the arthrogram injection and then go downstairs for a CT.  Injection today for CT arthrogram.  Follow-up for results.

## 2019-06-16 NOTE — Assessment & Plan Note (Signed)
We last injected her in November 2019, ultrasound showed a near full-thickness full width retracted tear of the supraspinatus with only a few fibers intact. She never got the MRI as was ordered back in April. I referred her to Dr. Griffin Basil, she ended up going to [facility] orthopedic surgery, they also injected her and ordered an MRI, she still did not do it. I informed her today that there was nothing that we could do nonoperative, and that we would need an MRI in anticipation of surgery she got significantly claustrophobic so we are going to add some Valium to take beforehand. Return to see me to go over MRI results.  Patient was unable to tolerate MRI even with a couple of Valium on board so we are going to proceed with CT arthrography of the right shoulder, she can come to see me for the arthrogram injection and then go downstairs for a CT.  Injection today for CT arthrogram.  Follow-up for results.

## 2019-06-21 ENCOUNTER — Ambulatory Visit: Payer: BC Managed Care – PPO | Admitting: Sports Medicine

## 2019-08-08 DIAGNOSIS — Z20828 Contact with and (suspected) exposure to other viral communicable diseases: Secondary | ICD-10-CM | POA: Diagnosis not present

## 2019-08-08 DIAGNOSIS — Z1383 Encounter for screening for respiratory disorder NEC: Secondary | ICD-10-CM | POA: Diagnosis not present

## 2019-08-15 DIAGNOSIS — H2511 Age-related nuclear cataract, right eye: Secondary | ICD-10-CM | POA: Diagnosis not present

## 2019-08-15 DIAGNOSIS — H401133 Primary open-angle glaucoma, bilateral, severe stage: Secondary | ICD-10-CM | POA: Diagnosis not present

## 2019-08-15 DIAGNOSIS — Z961 Presence of intraocular lens: Secondary | ICD-10-CM | POA: Diagnosis not present

## 2019-08-24 ENCOUNTER — Ambulatory Visit: Payer: BC Managed Care – PPO | Admitting: Sports Medicine

## 2019-08-26 ENCOUNTER — Ambulatory Visit (INDEPENDENT_AMBULATORY_CARE_PROVIDER_SITE_OTHER): Payer: BC Managed Care – PPO | Admitting: Sports Medicine

## 2019-08-26 ENCOUNTER — Encounter: Payer: Self-pay | Admitting: Sports Medicine

## 2019-08-26 ENCOUNTER — Other Ambulatory Visit: Payer: Self-pay

## 2019-08-26 DIAGNOSIS — S46011S Strain of muscle(s) and tendon(s) of the rotator cuff of right shoulder, sequela: Secondary | ICD-10-CM

## 2019-08-26 NOTE — Assessment & Plan Note (Signed)
Could not tolerate MRI due to claustrophobia, we did a CT arthrography, this showed full-thickness supraspinatus and infraspinatus tearing with retraction. Interestingly her shoulder pain has improved and she has full range of motion after the CT arthrogram. No further intervention or evaluation needed. She declines any form of surgical intervention or repair as well.

## 2019-08-26 NOTE — Progress Notes (Signed)
Subjective:    CC: Follow-up  HPI: Caroline Silva returns, he is a pleasant 63 year old female, CT arthrography showed full-thickness retracted tears of the supraspinatus and infraspinatus.  As part of the CT arthrography we included some steroid and she returns today fully pain-free and with full range of motion.  She is here because she really just needs a note written for work.  She tells me the note only needs to document her injury.  I reviewed the past medical history, family history, social history, surgical history, and allergies today and no changes were needed.  Please see the problem list section below in epic for further details.  Past Medical History: Past Medical History:  Diagnosis Date  . Cataract   . Glaucoma   . Heart murmur   . Hypertriglyceridemia without hypercholesterolemia 02/17/2017  . Obesity   . Prediabetes 06/30/2012   Past Surgical History: Past Surgical History:  Procedure Laterality Date  . GLAUCOMA SURGERY     Social History: Social History   Socioeconomic History  . Marital status: Single    Spouse name: Not on file  . Number of children: Not on file  . Years of education: Not on file  . Highest education level: Not on file  Occupational History  . Not on file  Social Needs  . Financial resource strain: Not on file  . Food insecurity    Worry: Not on file    Inability: Not on file  . Transportation needs    Medical: Not on file    Non-medical: Not on file  Tobacco Use  . Smoking status: Never Smoker  . Smokeless tobacco: Never Used  Substance and Sexual Activity  . Alcohol use: No  . Drug use: No  . Sexual activity: Yes  Lifestyle  . Physical activity    Days per week: Not on file    Minutes per session: Not on file  . Stress: Not on file  Relationships  . Social Herbalist on phone: Not on file    Gets together: Not on file    Attends religious service: Not on file    Active member of club or organization: Not on file   Attends meetings of clubs or organizations: Not on file    Relationship status: Not on file  Other Topics Concern  . Not on file  Social History Narrative  . Not on file   Family History: Family History  Problem Relation Age of Onset  . Diabetes Brother   . Cancer Neg Hx   . Heart attack Neg Hx   . Stroke Neg Hx    Allergies: No Known Allergies Medications: See med rec.  Review of Systems: No fevers, chills, night sweats, weight loss, chest pain, or shortness of breath.   Objective:    General: Well Developed, well nourished, and in no acute distress.  Neuro: Alert and oriented x3, extra-ocular muscles intact, sensation grossly intact.  HEENT: Normocephalic, atraumatic, pupils equal round reactive to light, neck supple, no masses, no lymphadenopathy, thyroid nonpalpable.  Skin: Warm and dry, no rashes. Cardiac: Regular rate and rhythm, no murmurs rubs or gallops, no lower extremity edema.  Respiratory: Clear to auscultation bilaterally. Not using accessory muscles, speaking in full sentences. Right shoulder: Inspection reveals no abnormalities, atrophy or asymmetry. Palpation is normal with no tenderness over AC joint or bicipital groove. ROM is full in all planes. Rotator cuff strength normal throughout. No signs of impingement with negative Neer and Hawkin's tests, empty  can. Speeds and Yergason's tests normal. No labral pathology noted with negative Obrien's, negative crank, negative clunk, and good stability. Normal scapular function observed. No painful arc and no drop arm sign. No apprehension sign  Impression and Recommendations:    Rotator cuff tear, right Could not tolerate MRI due to claustrophobia, we did a CT arthrography, this showed full-thickness supraspinatus and infraspinatus tearing with retraction. Interestingly her shoulder pain has improved and she has full range of motion after the CT arthrogram. No further intervention or evaluation needed. She  declines any form of surgical intervention or repair as well.   ___________________________________________ Gwen Her. Dianah Field, M.D., ABFM., CAQSM. Primary Care and Sports Medicine [facility]  Adjunct Professor of [place] of [employer] [facility] [employer] Medicine

## 2019-09-19 ENCOUNTER — Other Ambulatory Visit: Payer: Self-pay | Admitting: Physician Assistant

## 2019-09-19 DIAGNOSIS — Z1231 Encounter for screening mammogram for malignant neoplasm of breast: Secondary | ICD-10-CM

## 2019-09-29 DIAGNOSIS — R509 Fever, unspecified: Secondary | ICD-10-CM | POA: Diagnosis not present

## 2019-09-29 DIAGNOSIS — M791 Myalgia, unspecified site: Secondary | ICD-10-CM | POA: Diagnosis not present

## 2019-09-29 DIAGNOSIS — U071 COVID-19: Secondary | ICD-10-CM | POA: Diagnosis not present

## 2019-09-29 DIAGNOSIS — F330 Major depressive disorder, recurrent, mild: Secondary | ICD-10-CM | POA: Diagnosis not present

## 2019-10-07 DIAGNOSIS — U071 COVID-19: Secondary | ICD-10-CM | POA: Diagnosis not present

## 2019-10-24 ENCOUNTER — Encounter: Payer: Self-pay | Admitting: Medical-Surgical

## 2019-10-24 ENCOUNTER — Other Ambulatory Visit: Payer: Self-pay

## 2019-10-24 ENCOUNTER — Ambulatory Visit (INDEPENDENT_AMBULATORY_CARE_PROVIDER_SITE_OTHER): Payer: BC Managed Care – PPO | Admitting: Medical-Surgical

## 2019-10-24 VITALS — BP 102/64 | HR 69 | Temp 97.7°F | Ht 66.0 in | Wt 177.1 lb

## 2019-10-24 DIAGNOSIS — B372 Candidiasis of skin and nail: Secondary | ICD-10-CM | POA: Insufficient documentation

## 2019-10-24 MED ORDER — NYSTATIN 100000 UNIT/GM EX POWD: 1.0000 "application " | Freq: Three times a day (TID) | CUTANEOUS | 0 refills | Status: DC

## 2019-10-24 NOTE — Assessment & Plan Note (Addendum)
Apply nystatin powder to groin 3 times daily until healed.  May continue to use as needed.  Keep area as dry as possible (using powder, placing moisture wicking material in the area at night, etc.).  If no improvement with topical treatment, consider oral antifungals but will need metabolic panel first.

## 2019-10-24 NOTE — Addendum Note (Signed)
Addended bySamuel Bouche on: 10/24/2019 11:33 AM   Modules accepted: Level of Service

## 2019-10-24 NOTE — Progress Notes (Signed)
Subjective:    CC: Rash on both legs  HPI:  Pleasant 63 year old female presenting with complaints of rash on bilateral legs.  Rash located in groin area, present x2 weeks.  Reports increased sweating especially at night and in the groin area.  Has tried powders, lotions, and diaper cream.  Reports intermittent itching in upper body.  Notes a change in soap approximately 3 weeks ago.   I reviewed the past medical history, family history, social history, surgical history, and allergies today and no changes were needed.  Please see the problem list section below in epic for further details.  Past Medical History: Past Medical History:  Diagnosis Date  . Cataract   . Glaucoma   . Heart murmur   . Hypertriglyceridemia without hypercholesterolemia 02/17/2017  . Obesity   . Prediabetes 06/30/2012   Past Surgical History: Past Surgical History:  Procedure Laterality Date  . GLAUCOMA SURGERY     Social History: Social History   Socioeconomic History  . Marital status: Single    Spouse name: Not on file  . Number of children: Not on file  . Years of education: Not on file  . Highest education level: Not on file  Occupational History  . Not on file  Tobacco Use  . Smoking status: Never Smoker  . Smokeless tobacco: Never Used  Substance and Sexual Activity  . Alcohol use: No  . Drug use: No  . Sexual activity: Yes  Other Topics Concern  . Not on file  Social History Narrative  . Not on file   Social Determinants of Health   Financial Resource Strain:   . Difficulty of Paying Living Expenses: Not on file  Food Insecurity:   . Worried About Charity fundraiser in the Last Year: Not on file  . Ran Out of Food in the Last Year: Not on file  Transportation Needs:   . Lack of Transportation (Medical): Not on file  . Lack of Transportation (Non-Medical): Not on file  Physical Activity:   . Days of Exercise per Week: Not on file  . Minutes of Exercise per Session: Not on file   Stress:   . Feeling of Stress : Not on file  Social Connections:   . Frequency of Communication with Friends and Family: Not on file  . Frequency of Social Gatherings with Friends and Family: Not on file  . Attends Religious Services: Not on file  . Active Member of Clubs or Organizations: Not on file  . Attends Archivist Meetings: Not on file  . Marital Status: Not on file   Family History: Family History  Problem Relation Age of Onset  . Diabetes Brother   . Cancer Neg Hx   . Heart attack Neg Hx   . Stroke Neg Hx    Allergies: No Known Allergies Medications: See med rec.  Review of Systems: No fevers, chills, night sweats, weight loss, chest pain, or shortness of breath.   Objective:    General: Well Developed, well nourished, and in no acute distress.  Neuro: Alert and oriented x3.  HEENT: Normocephalic, atraumatic.  Skin: Warm and dry.  Dark pink, shiny rash noted to bilateral inguinal creases, irregular edges, no visible break in skin. Cardiac: Regular rate and rhythm, no murmurs rubs or gallops.  Respiratory: Clear to auscultation bilaterally. Not using accessory muscles, speaking in full sentences.   Impression and Recommendations:    Candidiasis of skin Apply nystatin powder to groin 3 times daily until  healed.  May continue to use as needed.  Keep area as dry as possible (using powder, placing moisture wicking material in the area at night, etc.).  If no improvement with topical treatment, consider oral antifungals but will need metabolic panel first.   Return if symptoms worsen or fail to improve.   ___________________________________________ Clearnce Sorrel, DNP, APRN, FNP-BC Primary Care and [place]

## 2019-10-26 ENCOUNTER — Other Ambulatory Visit: Payer: Self-pay

## 2019-10-26 ENCOUNTER — Ambulatory Visit (INDEPENDENT_AMBULATORY_CARE_PROVIDER_SITE_OTHER): Payer: BC Managed Care – PPO

## 2019-10-26 DIAGNOSIS — Z1231 Encounter for screening mammogram for malignant neoplasm of breast: Secondary | ICD-10-CM | POA: Diagnosis not present

## 2019-11-07 DIAGNOSIS — E7800 Pure hypercholesterolemia, unspecified: Secondary | ICD-10-CM | POA: Diagnosis not present

## 2019-11-07 DIAGNOSIS — K219 Gastro-esophageal reflux disease without esophagitis: Secondary | ICD-10-CM | POA: Diagnosis not present

## 2019-11-07 DIAGNOSIS — R21 Rash and other nonspecific skin eruption: Secondary | ICD-10-CM | POA: Diagnosis not present

## 2019-11-07 DIAGNOSIS — R7301 Impaired fasting glucose: Secondary | ICD-10-CM | POA: Diagnosis not present

## 2019-11-07 DIAGNOSIS — Z79899 Other long term (current) drug therapy: Secondary | ICD-10-CM | POA: Diagnosis not present

## 2019-12-29 DIAGNOSIS — H2511 Age-related nuclear cataract, right eye: Secondary | ICD-10-CM | POA: Diagnosis not present

## 2019-12-29 DIAGNOSIS — H401133 Primary open-angle glaucoma, bilateral, severe stage: Secondary | ICD-10-CM | POA: Diagnosis not present

## 2019-12-29 DIAGNOSIS — Z961 Presence of intraocular lens: Secondary | ICD-10-CM | POA: Diagnosis not present

## 2020-03-27 ENCOUNTER — Ambulatory Visit (INDEPENDENT_AMBULATORY_CARE_PROVIDER_SITE_OTHER): Payer: BC Managed Care – PPO | Admitting: Medical-Surgical

## 2020-03-27 ENCOUNTER — Encounter: Payer: Self-pay | Admitting: Medical-Surgical

## 2020-03-27 VITALS — BP 124/78 | HR 47 | Temp 98.2°F | Ht 66.0 in | Wt 170.6 lb

## 2020-03-27 DIAGNOSIS — G4700 Insomnia, unspecified: Secondary | ICD-10-CM

## 2020-03-27 DIAGNOSIS — Z114 Encounter for screening for human immunodeficiency virus [HIV]: Secondary | ICD-10-CM | POA: Diagnosis not present

## 2020-03-27 DIAGNOSIS — Z23 Encounter for immunization: Secondary | ICD-10-CM | POA: Diagnosis not present

## 2020-03-27 DIAGNOSIS — F329 Major depressive disorder, single episode, unspecified: Secondary | ICD-10-CM

## 2020-03-27 DIAGNOSIS — F419 Anxiety disorder, unspecified: Secondary | ICD-10-CM

## 2020-03-27 MED ORDER — TRAZODONE HCL 50 MG PO TABS: 25.0000 mg | ORAL_TABLET | Freq: Every evening | ORAL | 3 refills | Status: DC | PRN

## 2020-03-27 NOTE — Progress Notes (Signed)
Subjective:    CC: stress, insomnia  HPI: Caroline Silva 64 year old female presenting today with concerns of increased stress and difficulty sleeping. Reports noting an increase in stress related to family and work over the past few months. Now is having difficulty sleeping at night. Averages about 5 hours of sleep per night, nonrestorative. Wakes in the early morning hours with a HA and occasionally says breathing feels hard to do. This only occurs on some nights. Denies snoring and has not been told she snores. Notes sleep has been worse over the last 2 weeks. Feels that she may be less stressed if she could sleep better. Reports previously taking a medication to calm her nerves but is unable to remember the name of it. No current counseling or medication intervention. Denies SI/HI. Denies chest pain, palpitations, orthopnea, lower extremity edema, and panic attacks.  I reviewed the past medical history, family history, social history, surgical history, and allergies today and no changes were needed.  Please see the problem list section below in epic for further details.  Past Medical History: Past Medical History:  Diagnosis Date  . Cataract   . Glaucoma   . Heart murmur   . Hypertriglyceridemia without hypercholesterolemia 02/17/2017  . Obesity   . Prediabetes 06/30/2012   Past Surgical History: Past Surgical History:  Procedure Laterality Date  . GLAUCOMA SURGERY     Social History: Social History   Socioeconomic History  . Marital status: Single    Spouse name: Not on file  . Number of children: Not on file  . Years of education: Not on file  . Highest education level: Not on file  Occupational History  . Not on file  Tobacco Use  . Smoking status: Never Smoker  . Smokeless tobacco: Never Used  Substance and Sexual Activity  . Alcohol use: No  . Drug use: No  . Sexual activity: Yes  Other Topics Concern  . Not on file  Social History Narrative  . Not on file   Social  Determinants of Health   Financial Resource Strain:   . Difficulty of Paying Living Expenses:   Food Insecurity:   . Worried About Charity fundraiser in the Last Year:   . Arboriculturist in the Last Year:   Transportation Needs:   . Film/video editor (Medical):   Marland Kitchen Lack of Transportation (Non-Medical):   Physical Activity:   . Days of Exercise per Week:   . Minutes of Exercise per Session:   Stress:   . Feeling of Stress :   Social Connections:   . Frequency of Communication with Friends and Family:   . Frequency of Social Gatherings with Friends and Family:   . Attends Religious Services:   . Active Member of Clubs or Organizations:   . Attends Archivist Meetings:   Marland Kitchen Marital Status:    Family History: Family History  Problem Relation Age of Onset  . Diabetes Brother   . Cancer Neg Hx   . Heart attack Neg Hx   . Stroke Neg Hx    Allergies: No Known Allergies Medications: See med rec.  Review of Systems: No fevers, chills, night sweats, weight loss, chest pain, or shortness of breath.   Objective:    General: Well Developed, well nourished, and in no acute distress.  Neuro: Alert and oriented x3.  HEENT: Normocephalic, atraumatic.  Skin: Warm and dry. Cardiac: Regular rate and rhythm, no murmurs rubs or gallops, no lower extremity edema.  Respiratory: Clear to auscultation bilaterally. Not using accessory muscles, speaking in full sentences.   Impression and Recommendations:    1. Insomnia, unspecified type Reviewed sleep hygiene measures. Start Trazodone  - traZODone (DESYREL) 50 MG tablet; Take 0.5-1 ta-mg nightly as needed for sleep.   2. Anxiety and depression Will start Trazodone as needed for sleep in hopes that better sleep will allow for a reduction in stress. Discussed counseling, patient declined at this time. If better sleep not helpful in reducing stress/anxiety, may benefit from daily maintenance antidepressant. - traZODone  (DESYREL) 50 MG tablet; Take 0.5-1 tablets (25-50 mg total) by mouth at bedtime as needed for sleep.  Dispense: 30 tablet; Refill: 3  3. Need for Tdap vaccination Tdap given in office by MA today. - Tdap vaccine greater than or equal to 7yo IM  4. Screening for HIV (human immunodeficiency virus) Will add HIV antibody to next blood draw. - HIV Antibody (routine testing w rflx)  Return in about 4 weeks (around 04/24/2020) for anxiety/insomia; at convenience for CPE w/pap. ___________________________________________ Clearnce Sorrel, DNP, APRN, FNP-BC Primary [facility] and [place]

## 2020-04-02 ENCOUNTER — Other Ambulatory Visit: Payer: Self-pay

## 2020-04-02 ENCOUNTER — Encounter: Payer: Self-pay | Admitting: Emergency Medicine

## 2020-04-02 ENCOUNTER — Other Ambulatory Visit (HOSPITAL_COMMUNITY)
Admission: RE | Admit: 2020-04-02 | Discharge: 2020-04-02 | Disposition: A | Discharge status: Home / Self Care | Payer: BC Managed Care – PPO | Source: Ambulatory Visit | Attending: Family Medicine | Admitting: Family Medicine

## 2020-04-02 ENCOUNTER — Emergency Department (INDEPENDENT_AMBULATORY_CARE_PROVIDER_SITE_OTHER)
Admission: EM | Admit: 2020-04-02 | Discharge: 2020-04-02 | Disposition: A | Discharge status: Home / Self Care | Payer: BC Managed Care – PPO | Source: Home / Self Care

## 2020-04-02 DIAGNOSIS — R300 Dysuria: Secondary | ICD-10-CM | POA: Insufficient documentation

## 2020-04-02 DIAGNOSIS — N760 Acute vaginitis: Secondary | ICD-10-CM | POA: Diagnosis not present

## 2020-04-02 LAB — POCT URINALYSIS DIP (MANUAL ENTRY)
Bilirubin, UA: NEGATIVE
Glucose, UA: NEGATIVE mg/dL
Ketones, POC UA: NEGATIVE mg/dL
Nitrite, UA: NEGATIVE
Protein Ur, POC: NEGATIVE mg/dL
Spec Grav, UA: 1.02 (ref 1.010–1.025)
Urobilinogen, UA: 1 E.U./dL
pH, UA: 7.5 (ref 5.0–8.0)

## 2020-04-02 MED ORDER — FLUCONAZOLE 200 MG PO TABS: 200.0000 mg | ORAL_TABLET | Freq: Once | ORAL | 0 refills | Status: AC

## 2020-04-02 MED ORDER — PHENAZOPYRIDINE HCL 200 MG PO TABS: 200.0000 mg | ORAL_TABLET | Freq: Three times a day (TID) | ORAL | 0 refills | Status: DC

## 2020-04-02 NOTE — ED Triage Notes (Signed)
Dysuria x 4 days 

## 2020-04-02 NOTE — ED Provider Notes (Signed)
[facility]    CSN: [id number] Arrival date & time: 04/02/20  1911      History   Chief Complaint Chief Complaint  Patient presents with  . Dysuria    HPI Caroline Silva is a 64 y.o. female.   HPI  Caroline Silva is a 64 y.o. female presenting to UC with c/o dysuria for 4 days. She also reports having an itchy rash in her groin for about 1 month. She has been to her PCP and dermatologist but states the cream she was prescribed has not been helping.  Denies vaginal discharge or bleeding. Denies hematuria.  Denies fever, chills, n/v/d.      Past Medical History:  Diagnosis Date  . Cataract   . Glaucoma   . Heart murmur   . Hypertriglyceridemia without hypercholesterolemia 02/17/2017  . Obesity   . Prediabetes 06/30/2012    Patient Active Problem List   Diagnosis Date Noted  . Candidiasis of skin 10/24/2019  . Rotator cuff tear, right 06/10/2018  . Dermoid cyst of scalp 10/21/2017  . Class 1 obesity due to excess calories without serious comorbidity with body mass index (BMI) of 30.0 to 30.9 in adult 02/17/2017  . Hypertriglyceridemia without hypercholesterolemia 02/17/2017  . Systolic ejection murmur 03/50/0938  . Onychomycosis 02/13/2017  . Left shoulder pain 11/06/2015  . HPV test positive 01/26/2014  . Prediabetes 06/30/2012    Past Surgical History:  Procedure Laterality Date  . GLAUCOMA SURGERY      OB History   No obstetric history on file.      Home Medications    Prior to Admission medications   Medication Sig Start Date End Date Taking? Authorizing Provider  brimonidine-timolol (COMBIGAN) 0.2-0.5 % ophthalmic solution Place 1 drop into both eyes 2 (two) times daily. 04/12/15   [provider]  brinzolamide (AZOPT) 1 % ophthalmic suspension Place 1 drop into both eyes in the morning, at noon, and at bedtime. 11/16/19   [provider]  fluconazole (DIFLUCAN) 200 MG tablet Take 1 tablet (200 mg total) by mouth once for 1  dose. May repeat in 3 days if not improving 04/02/20 04/02/20  Noe Gens, PA-C  latanoprost (XALATAN) 0.005 % ophthalmic solution Apply 1 drop to eye at bedtime. 03/23/20   [provider]  Multiple Vitamins-Minerals (MULTIVITAMIN ADULT PO) Take 1 tablet by mouth daily.     [provider]  phenazopyridine (PYRIDIUM) 200 MG tablet Take 1 tablet (200 mg total) by mouth 3 (three) times daily. 04/02/20   Noe Gens, PA-C  traZODone (DESYREL) 50 MG tablet Take 0.5-1 tablets (25-50 mg total) by mouth at bedtime as needed for sleep. 03/27/20   Samuel Bouche, NP    Family History Family History  Problem Relation Age of Onset  . Diabetes Brother   . Cancer Neg Hx   . Heart attack Neg Hx   . Stroke Neg Hx     Social History Social History   Tobacco Use  . Smoking status: Never Smoker  . Smokeless tobacco: Never Used  Substance Use Topics  . Alcohol use: No  . Drug use: No     Allergies   Patient has no known allergies.   Review of Systems Review of Systems  Constitutional: Negative for chills and fever.  Gastrointestinal: Negative for diarrhea, nausea and vomiting.  Genitourinary: Positive for dysuria. Negative for flank pain, hematuria, vaginal bleeding, vaginal discharge and vaginal pain.  Musculoskeletal: Negative for back pain.  Skin:  Positive for rash.     Physical Exam Triage Vital Signs ED Triage Vitals  Enc Vitals Group     BP 04/02/20 1924 134/84     Pulse Rate 04/02/20 1924 84     Resp 04/02/20 1924 16     Temp 04/02/20 1924 (!) 97.4 F (36.3 C)     Temp Source 04/02/20 1924 Oral     SpO2 04/02/20 1924 100 %     Weight 04/02/20 1926 170 lb (77.1 kg)     Height 04/02/20 1926 5\' 6"  (1.676 m)     Head Circumference --      Peak Flow --      Pain Score 04/02/20 1925 3     Pain Loc --      Pain Edu? --      Excl. in [place]? --    No data found.  Updated Vital Signs BP 134/84 (BP Location: Right Arm)   Pulse 84   Temp (!) 97.4 F (36.3 C)  (Oral)   Resp 16   Ht 5\' 6"  (1.676 m)   Wt 170 lb (77.1 kg)   SpO2 100%   BMI 27.44 kg/m   Visual Acuity Right Eye Distance:   Left Eye Distance:   Bilateral Distance:    Right Eye Near:   Left Eye Near:    Bilateral Near:     Physical Exam Vitals and nursing note reviewed. Exam conducted with a chaperone present.  Constitutional:      Appearance: Normal appearance. She is well-developed.  HENT:     Head: Normocephalic and atraumatic.  Cardiovascular:     Rate and Rhythm: Normal rate.  Pulmonary:     Effort: Pulmonary effort is normal.  Genitourinary:    Vagina: Vaginal discharge (small amount cream in color ) present.       Comments: Erythematous rash covered with white medication in inguinal folds. Musculoskeletal:        General: Normal range of motion.     Cervical back: Normal range of motion.  Skin:    General: Skin is warm and dry.  Neurological:     Mental Status: She is alert and oriented to person, place, and time.  Psychiatric:        Behavior: Behavior normal.      UC Treatments / Results  Labs (all labs ordered are listed, but only abnormal results are displayed) Labs Reviewed  POCT URINALYSIS DIP (MANUAL ENTRY) - Abnormal; Notable for the following components:      Result Value   Blood, UA trace-intact (*)    Leukocytes, UA Trace (*)    All other components within normal limits  URINE CULTURE  CERVICOVAGINAL ANCILLARY ONLY    EKG   Radiology No results found.  Procedures Procedures (including critical care time)  Medications Ordered in UC Medications - No data to display  Initial Impression / Assessment and Plan / UC Course  I have reviewed the triage vital signs and the nursing notes.  Pertinent labs & imaging results that were available during my care of the patient were reviewed by me and considered in my medical decision making (see chart for details).     Vaginal swab sent to lab Will start pt on diflucan UA no definite  UTI Culture sent F/u with PCP  AVS provided  Final Clinical Impressions(s) / UC Diagnoses   Final diagnoses:  Vaginitis and vulvovaginitis  Dysuria   Discharge Instructions   None    ED Prescriptions  Medication Sig Dispense Auth. Provider   fluconazole (DIFLUCAN) 200 MG tablet Take 1 tablet (200 mg total) by mouth once for 1 dose. May repeat in 3 days if not improving 2 tablet Gerarda Fraction, Erin O, PA-C   phenazopyridine (PYRIDIUM) 200 MG tablet Take 1 tablet (200 mg total) by mouth 3 (three) times daily. 6 tablet Noe Gens, PA-C     PDMP not reviewed this encounter.   Noe Gens, [place] 04/02/20 2033

## 2020-04-03 LAB — URINE CULTURE
MICRO NUMBER:: 10561221
SPECIMEN QUALITY:: ADEQUATE

## 2020-04-04 LAB — CERVICOVAGINAL ANCILLARY ONLY
Bacterial Vaginitis (gardnerella): NEGATIVE
Candida Glabrata: NEGATIVE
Candida Vaginitis: NEGATIVE
Chlamydia: NEGATIVE
Comment: NEGATIVE
Comment: NEGATIVE
Comment: NEGATIVE
Comment: NEGATIVE
Comment: NEGATIVE
Comment: NORMAL
Neisseria Gonorrhea: NEGATIVE
Trichomonas: NEGATIVE

## 2020-04-07 ENCOUNTER — Emergency Department
Admission: EM | Admit: 2020-04-07 | Discharge: 2020-04-07 | Discharge status: Against Medical Advice | Payer: BC Managed Care – PPO | Source: Home / Self Care

## 2020-04-07 ENCOUNTER — Other Ambulatory Visit: Payer: Self-pay

## 2020-04-09 DIAGNOSIS — Z20822 Contact with and (suspected) exposure to covid-19: Secondary | ICD-10-CM | POA: Diagnosis not present

## 2020-05-19 ENCOUNTER — Other Ambulatory Visit: Payer: Self-pay | Admitting: Medical-Surgical

## 2020-05-19 DIAGNOSIS — F329 Major depressive disorder, single episode, unspecified: Secondary | ICD-10-CM

## 2020-05-19 DIAGNOSIS — G4700 Insomnia, unspecified: Secondary | ICD-10-CM

## 2020-05-19 DIAGNOSIS — F419 Anxiety disorder, unspecified: Secondary | ICD-10-CM

## 2020-07-12 DIAGNOSIS — H401133 Primary open-angle glaucoma, bilateral, severe stage: Secondary | ICD-10-CM | POA: Diagnosis not present

## 2020-08-03 IMAGING — MG DIGITAL SCREENING BILATERAL MAMMOGRAM WITH TOMO AND CAD
8 series · 8 of 24 positions shown · non-contrast
Comparison: Previous exam(s).

CLINICAL DATA: Screening.

EXAM:
DIGITAL SCREENING BILATERAL MAMMOGRAM WITH TOMO AND CAD

[R CC synth-2D]
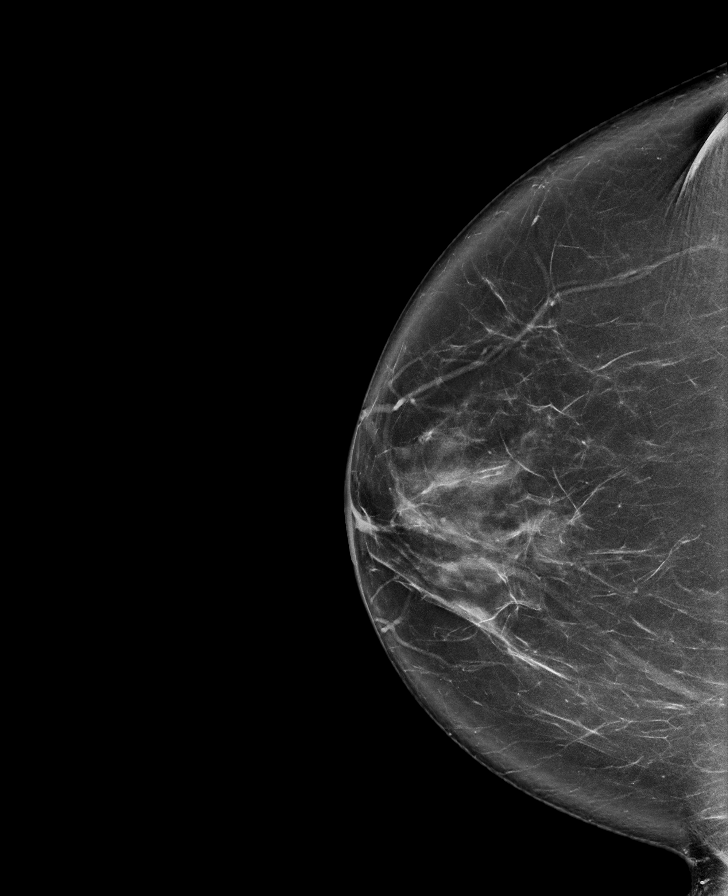

[L MLO synth-2D]
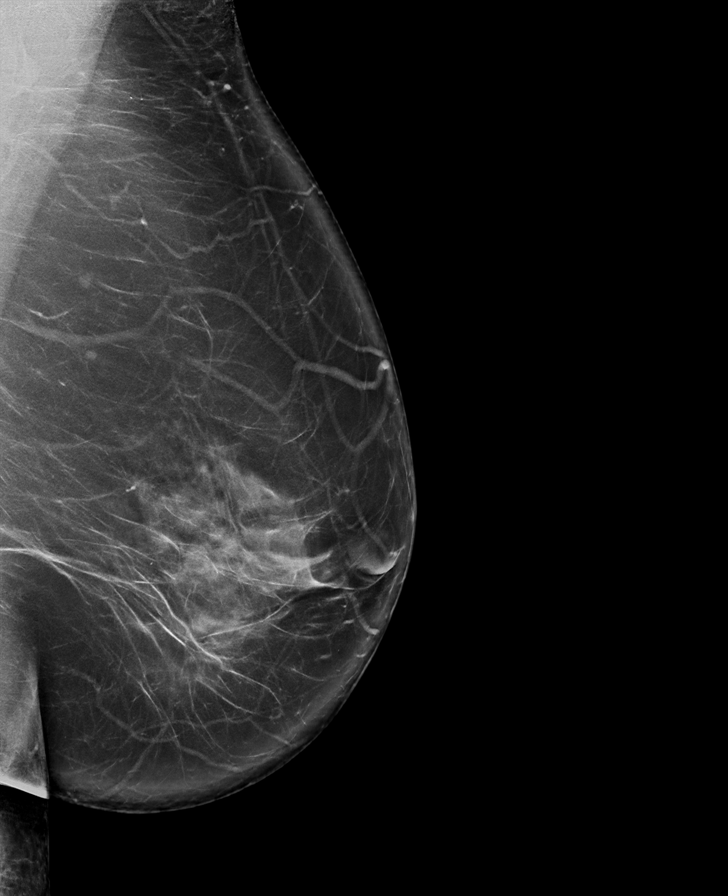

[L CC synth-2D]
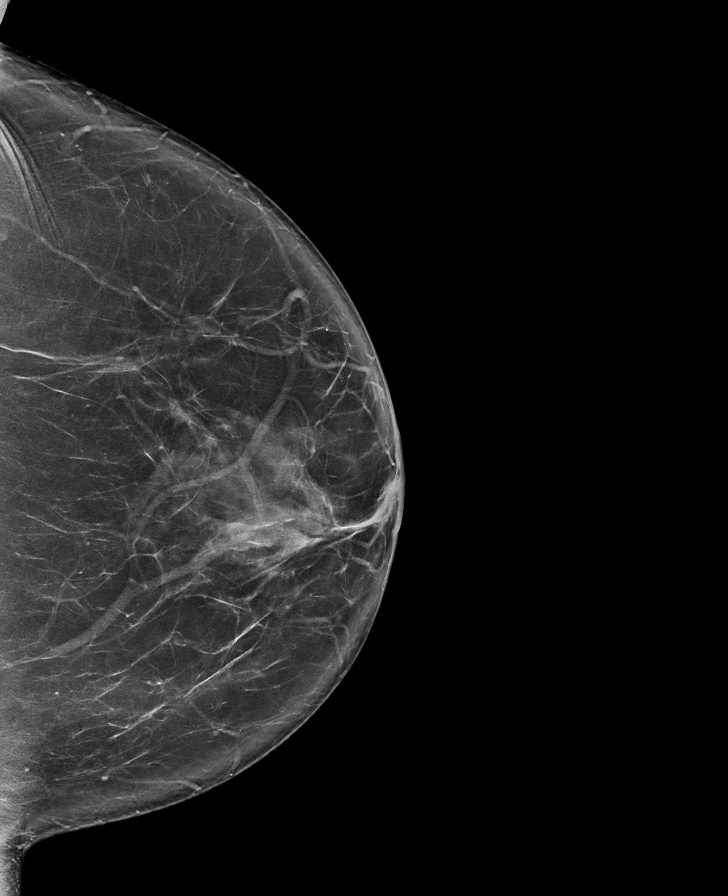

[R MLO synth-2D]
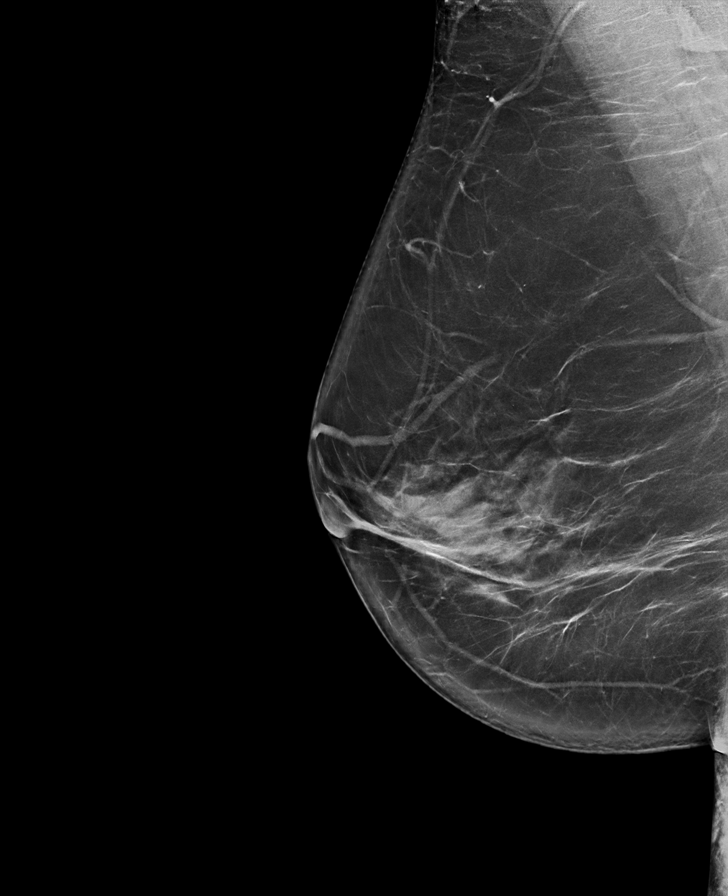

[R CC tomo · tomo slice 47/93.0]
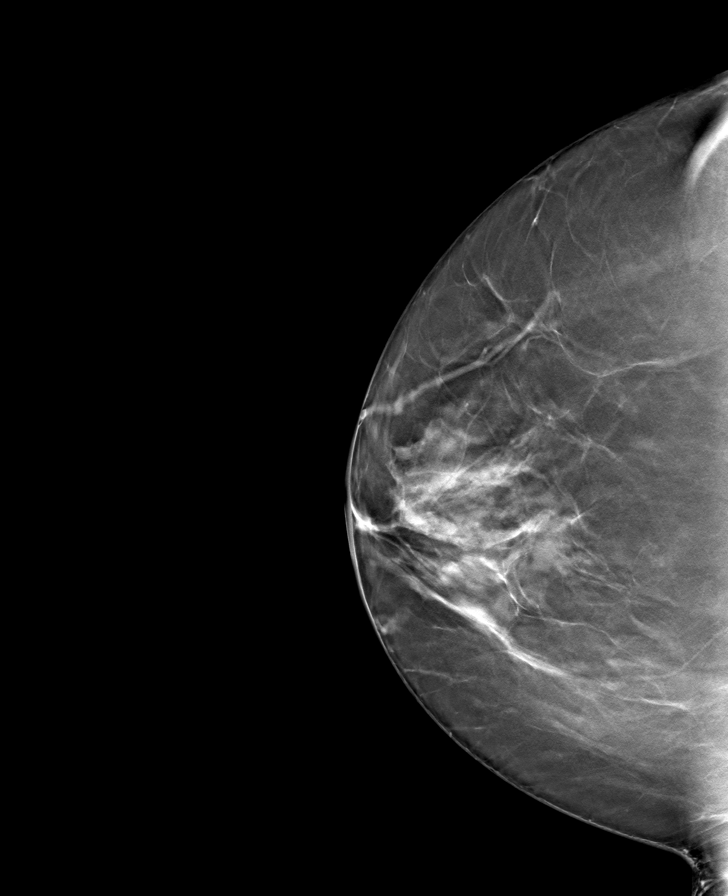

[L CC tomo · tomo slice 45/90.0]
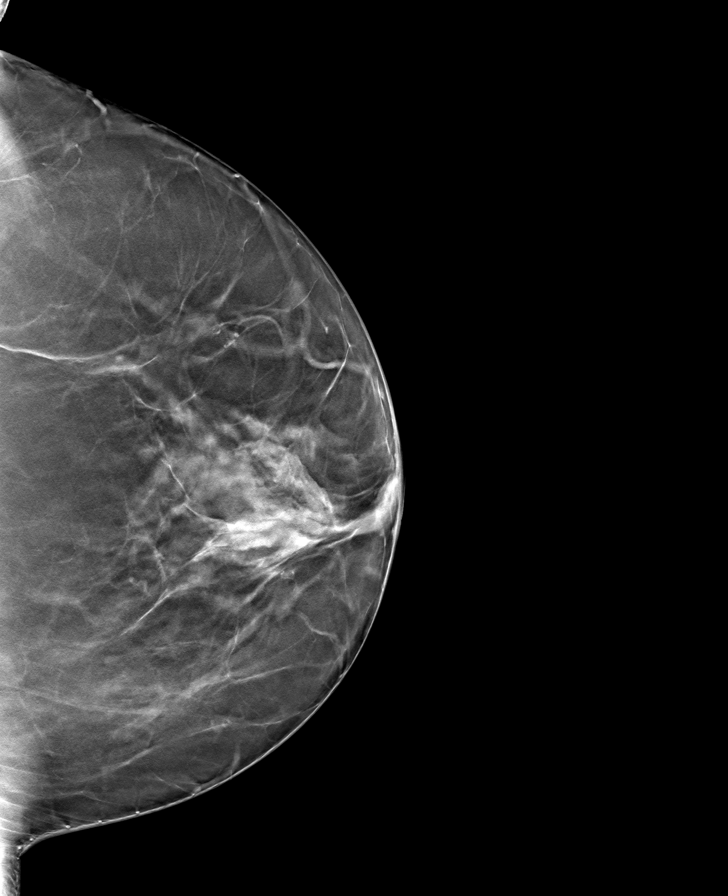

[L MLO tomo · tomo slice 52/103.0]
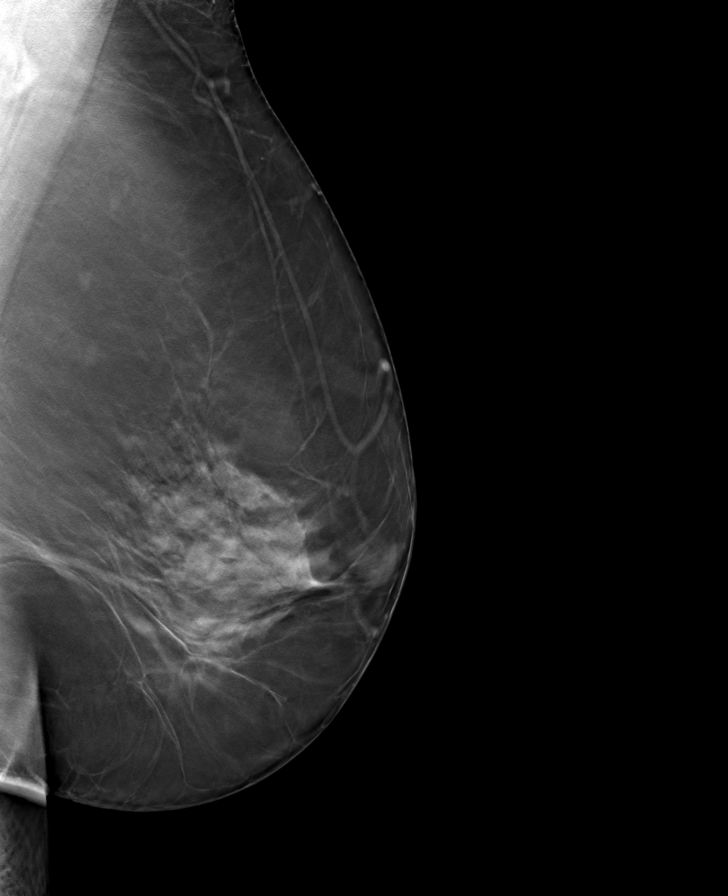

[R MLO tomo · tomo slice 47/92.0]
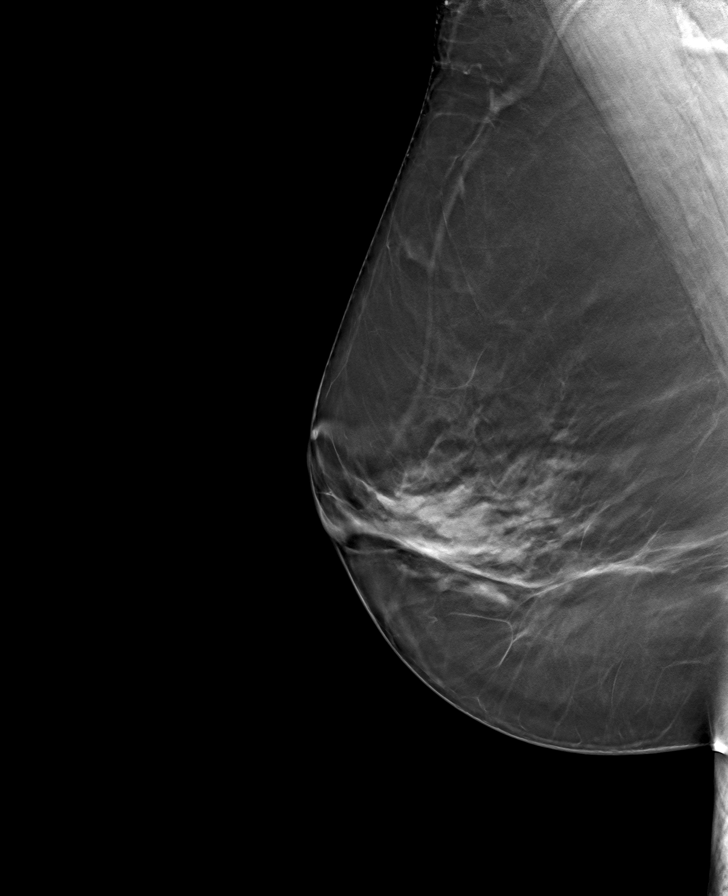

[8 of 24 positions shown; findings below may reference images not displayed]

ACR Breast Density Category c: The breast tissue is heterogeneously
dense, which may obscure small masses.
FINDINGS: There are no findings suspicious for malignancy. Images were
processed with CAD.
IMPRESSION: No mammographic evidence of malignancy. A result letter of this
screening mammogram will be mailed directly to the patient.

RECOMMENDATION:
Screening mammogram in one year. (Code:[id number]-U-LHB)

BI-RADS CATEGORY  1: Negative.

## 2020-10-08 DIAGNOSIS — H401133 Primary open-angle glaucoma, bilateral, severe stage: Secondary | ICD-10-CM | POA: Diagnosis not present

## 2020-10-15 DIAGNOSIS — H401133 Primary open-angle glaucoma, bilateral, severe stage: Secondary | ICD-10-CM | POA: Diagnosis not present

## 2020-10-15 DIAGNOSIS — H2511 Age-related nuclear cataract, right eye: Secondary | ICD-10-CM | POA: Diagnosis not present

## 2020-10-15 DIAGNOSIS — Z961 Presence of intraocular lens: Secondary | ICD-10-CM | POA: Diagnosis not present

## 2020-11-12 DIAGNOSIS — Z20822 Contact with and (suspected) exposure to covid-19: Secondary | ICD-10-CM | POA: Diagnosis not present

## 2020-12-28 DIAGNOSIS — H401133 Primary open-angle glaucoma, bilateral, severe stage: Secondary | ICD-10-CM | POA: Diagnosis not present

## 2021-01-07 DIAGNOSIS — R739 Hyperglycemia, unspecified: Secondary | ICD-10-CM | POA: Diagnosis not present

## 2021-01-07 DIAGNOSIS — Z0000 Encounter for general adult medical examination without abnormal findings: Secondary | ICD-10-CM | POA: Diagnosis not present

## 2021-01-07 DIAGNOSIS — K2100 Gastro-esophageal reflux disease with esophagitis, without bleeding: Secondary | ICD-10-CM | POA: Diagnosis not present

## 2021-01-07 DIAGNOSIS — F330 Major depressive disorder, recurrent, mild: Secondary | ICD-10-CM | POA: Diagnosis not present

## 2021-01-15 ENCOUNTER — Ambulatory Visit (INDEPENDENT_AMBULATORY_CARE_PROVIDER_SITE_OTHER): Payer: BC Managed Care – PPO | Admitting: Medical-Surgical

## 2021-01-15 ENCOUNTER — Encounter: Payer: Self-pay | Admitting: Medical-Surgical

## 2021-01-15 ENCOUNTER — Other Ambulatory Visit: Payer: Self-pay

## 2021-01-15 VITALS — BP 120/68 | HR 48 | Temp 97.7°F | Ht 66.0 in | Wt 165.3 lb

## 2021-01-15 DIAGNOSIS — F32A Depression, unspecified: Secondary | ICD-10-CM | POA: Diagnosis not present

## 2021-01-15 DIAGNOSIS — F419 Anxiety disorder, unspecified: Secondary | ICD-10-CM | POA: Insufficient documentation

## 2021-01-15 DIAGNOSIS — G4700 Insomnia, unspecified: Secondary | ICD-10-CM | POA: Diagnosis not present

## 2021-01-15 MED ORDER — AMITRIPTYLINE HCL 25 MG PO TABS: ORAL_TABLET | ORAL | 0 refills | Status: DC

## 2021-01-15 NOTE — Progress Notes (Signed)
  Subjective:    CC: anxiety/depression/insomnia  HPI: Pleasant 65 year old female presenting today to discuss anxiety/depression and insomnia. Was seen in June of 2021 and prescribed Trazodone to help with insomnia. She did try the medication for a few nights but found it unhelpful. Never returned for follow up. Today, she notes that she is having significant difficulty with anxiety/depression due to life changes with home and work. Notes that she lives with her daughter and her husband but is in the process of divorce. Having trouble with sleep due to anxiety. Notes that she had trouble last year but this year is so much worse. Initially presented to urgent care but was able to get in on a same day appointment here today. Started counseling at her church last week and plans to go every 2 weeks. Denies SI/HI.  I reviewed the past medical history, family history, social history, surgical history, and allergies today and no changes were needed.  Please see the problem list section below in epic for further details.  Past Medical History: Past Medical History:  Diagnosis Date  . Cataract   . Glaucoma   . Heart murmur   . Hypertriglyceridemia without hypercholesterolemia 02/17/2017  . Obesity   . Prediabetes 06/30/2012   Past Surgical History: Past Surgical History:  Procedure Laterality Date  . GLAUCOMA SURGERY     Social History: Social History   Socioeconomic History  . Marital status: Single    Spouse name: Not on file  . Number of children: Not on file  . Years of education: Not on file  . Highest education level: Not on file  Occupational History  . Not on file  Tobacco Use  . Smoking status: Never Smoker  . Smokeless tobacco: Never Used  Vaping Use  . Vaping Use: Never used  Substance and Sexual Activity  . Alcohol use: No  . Drug use: No  . Sexual activity: Yes  Other Topics Concern  . Not on file  Social History Narrative  . Not on file   Social Determinants of  Health   Financial Resource Strain: Not on file  Food Insecurity: Not on file  Transportation Needs: Not on file  Physical Activity: Not on file  Stress: Not on file  Social Connections: Not on file   Family History: Family History  Problem Relation Age of Onset  . Diabetes Brother   . Cancer Neg Hx   . Heart attack Neg Hx   . Stroke Neg Hx    Allergies: No Known Allergies Medications: See med rec.  Review of Systems: See HPI for pertinent positives and negatives.   Objective:    General: Well Developed, well nourished, and in no acute distress.  Neuro: Alert and oriented x3.  HEENT: Normocephalic, atraumatic.  Skin: Warm and dry. Cardiac: Regular rate and rhythm.  Respiratory:  Not using accessory muscles, speaking in full sentences.   Impression and Recommendations:    1. Anxiety and depression 2. Insomnia, unspecified type Start Amitriptyline 25mg  nightly for 1 week then increase to 50mg  nightly at bedtime. Continue counseling as planned.   Return in about 3 weeks (around 02/05/2021) for mood/insomnia follow up. ___________________________________________ Clearnce Sorrel, DNP, APRN, FNP-BC Primary Care and [place]

## 2021-01-30 ENCOUNTER — Other Ambulatory Visit: Payer: Self-pay | Admitting: Medical-Surgical

## 2021-02-18 DIAGNOSIS — H401133 Primary open-angle glaucoma, bilateral, severe stage: Secondary | ICD-10-CM | POA: Diagnosis not present

## 2021-02-25 DIAGNOSIS — Z961 Presence of intraocular lens: Secondary | ICD-10-CM | POA: Diagnosis not present

## 2021-02-25 DIAGNOSIS — H2511 Age-related nuclear cataract, right eye: Secondary | ICD-10-CM | POA: Diagnosis not present

## 2021-02-25 DIAGNOSIS — H401133 Primary open-angle glaucoma, bilateral, severe stage: Secondary | ICD-10-CM | POA: Diagnosis not present

## 2021-03-08 DIAGNOSIS — Z961 Presence of intraocular lens: Secondary | ICD-10-CM | POA: Diagnosis not present

## 2021-03-08 DIAGNOSIS — H2511 Age-related nuclear cataract, right eye: Secondary | ICD-10-CM | POA: Diagnosis not present

## 2021-03-08 DIAGNOSIS — H401133 Primary open-angle glaucoma, bilateral, severe stage: Secondary | ICD-10-CM | POA: Diagnosis not present

## 2021-03-08 DIAGNOSIS — H401113 Primary open-angle glaucoma, right eye, severe stage: Secondary | ICD-10-CM | POA: Diagnosis not present

## 2021-03-27 DIAGNOSIS — H401133 Primary open-angle glaucoma, bilateral, severe stage: Secondary | ICD-10-CM | POA: Diagnosis not present

## 2021-04-17 DIAGNOSIS — H401133 Primary open-angle glaucoma, bilateral, severe stage: Secondary | ICD-10-CM | POA: Diagnosis not present

## 2021-04-23 ENCOUNTER — Other Ambulatory Visit: Payer: Self-pay | Admitting: Medical-Surgical

## 2021-04-23 DIAGNOSIS — B372 Candidiasis of skin and nail: Secondary | ICD-10-CM

## 2021-06-17 ENCOUNTER — Ambulatory Visit: Payer: BC Managed Care – PPO | Admitting: Sports Medicine

## 2021-06-17 ENCOUNTER — Emergency Department: Payer: BC Managed Care – PPO

## 2021-06-17 ENCOUNTER — Emergency Department (INDEPENDENT_AMBULATORY_CARE_PROVIDER_SITE_OTHER)
Admission: EM | Admit: 2021-06-17 | Discharge: 2021-06-17 | Disposition: A | Discharge status: Home / Self Care | Payer: BC Managed Care – PPO | Source: Home / Self Care

## 2021-06-17 ENCOUNTER — Emergency Department (INDEPENDENT_AMBULATORY_CARE_PROVIDER_SITE_OTHER): Payer: BC Managed Care – PPO

## 2021-06-17 ENCOUNTER — Other Ambulatory Visit: Payer: Self-pay

## 2021-06-17 DIAGNOSIS — S66411A Strain of intrinsic muscle, fascia and tendon of right thumb at wrist and hand level, initial encounter: Secondary | ICD-10-CM

## 2021-06-17 DIAGNOSIS — M79644 Pain in right finger(s): Secondary | ICD-10-CM

## 2021-06-17 DIAGNOSIS — R52 Pain, unspecified: Secondary | ICD-10-CM

## 2021-06-17 MED ORDER — METHYLPREDNISOLONE 4 MG PO TBPK
ORAL_TABLET | ORAL | 0 refills | Status: DC
Start: 2021-06-17 — End: 2023-08-10

## 2021-06-17 MED ORDER — METHYLPREDNISOLONE ACETATE 80 MG/ML IJ SUSP
80.0000 mg | Freq: Once | INTRAMUSCULAR | Status: AC
  Administered 2021-06-17: 80 mg via INTRAMUSCULAR

## 2021-06-17 NOTE — ED Triage Notes (Signed)
Pt presents to Urgent Care with c/o worsening R thumb pain x 2 weeks. Denies injury. Swelling and warmth noted to distal R thumb.

## 2021-06-17 NOTE — Discharge Instructions (Addendum)
Advised patient to start Medrol Dosepak tomorrow morning, Tuesday, 06/18/2021.  Advised patient to wear right thumb spica brace for 2 weeks 24/7 (except with bathing).  Advised patient to take 1 week to rest right thumb and to avoid repetitive motion activities involving right hand and right thumb.

## 2021-06-17 NOTE — ED Provider Notes (Signed)
[facility]    CSN: [id number] Arrival date & time: 06/17/21  0920      History   Chief Complaint Chief Complaint  Patient presents with   thumb pain    Right    HPI Caroline Silva is a 65 y.o. female.   HPI Pleasant 65 year old female presents with worsening right thumb pain x 2 weeks.  Reports noted redness and swelling or warmth to right thumb.  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  Past Medical History:  Diagnosis Date   Cataract    Glaucoma    Heart murmur    Hypertriglyceridemia without hypercholesterolemia 02/17/2017   Obesity    Prediabetes 06/30/2012    Patient Active Problem List   Diagnosis Date Noted   Anxiety and depression 01/15/2021   Insomnia 01/15/2021   Candidiasis of skin 10/24/2019   Rotator cuff tear, right 06/10/2018   Dermoid cyst of scalp 10/21/2017   Class 1 obesity due to excess calories without serious comorbidity with body mass index (BMI) of 30.0 to 30.9 in adult 02/17/2017   Hypertriglyceridemia without hypercholesterolemia [id number]   Systolic ejection murmur [id number]   Onychomycosis 02/13/2017   Left shoulder pain 11/06/2015   HPV test positive 01/26/2014   Prediabetes 06/30/2012    Past Surgical History:  Procedure Laterality Date   GLAUCOMA SURGERY      OB History   No obstetric history on file.      Home Medications    Prior to Admission medications   Medication Sig Start Date End Date Taking? Authorizing Provider  methylPREDNISolone (MEDROL DOSEPAK) 4 MG TBPK tablet Take as directed. 06/17/21  Yes Eliezer Lofts, FNP  amitriptyline (ELAVIL) 25 MG tablet Take 1 tablet (25 mg total) by mouth at bedtime for 7 days, THEN 2 tablets (50 mg total) at bedtime for 23 days. 01/15/21 02/14/21  Samuel Bouche, NP  brimonidine-timolol (COMBIGAN) 0.2-0.5 % ophthalmic solution Place 1 drop into both  eyes 2 (two) times daily. 04/12/15   [provider]  brinzolamide (AZOPT) 1 % ophthalmic suspension Place 1 drop into both eyes in the morning, at noon, and at bedtime. 11/16/19   [provider]  latanoprost (XALATAN) 0.005 % ophthalmic solution Apply 1 drop to eye at bedtime. 03/23/20   [provider]  Multiple Vitamins-Minerals (MULTIVITAMIN ADULT PO) Take 1 tablet by mouth daily.     [provider]    Family History Family History  Problem Relation Age of Onset   Diabetes Mother    GI Bleed Father    Diabetes Brother    Cancer Neg Hx    Heart attack Neg Hx    Stroke Neg Hx     Social History Social History   Tobacco Use   Smoking status: Never   Smokeless tobacco: Never  Vaping Use   Vaping Use: Never used  Substance Use Topics   Alcohol use: No   Drug use: No     Allergies   Patient has no known allergies.   Review of Systems Review of Systems  Musculoskeletal:        Thumb pain x 2-3 weeks  All other systems reviewed and are negative.   Physical Exam Triage Vital Signs ED Triage Vitals  Enc Vitals Group     BP 06/17/21 1032 124/79     Pulse Rate 06/17/21 1032 (!) 44     Resp 06/17/21  1032 16     Temp 06/17/21 1032 98 F (36.7 C)     Temp src --      SpO2 06/17/21 1032 98 %     Weight 06/17/21 1027 170 lb (77.1 kg)     Height 06/17/21 1027 '5\' 6"'$  (1.676 m)     Head Circumference --      Peak Flow --      Pain Score 06/17/21 1026 10     Pain Loc --      Pain Edu? --      Excl. in [place]? --    No data found.  Updated Vital Signs BP 124/79 (BP Location: Right Arm)   Pulse (!) 44   Temp 98 F (36.7 C)   Resp 16   Ht '5\' 6"'$  (1.676 m)   Wt 170 lb (77.1 kg)   SpO2 98%   BMI 27.44 kg/m    Physical Exam Vitals and nursing note reviewed.  Constitutional:      General: She is not in acute distress.    Appearance: Normal appearance. She is normal weight. She is not ill-appearing.  HENT:     Head: Normocephalic  and atraumatic.     Mouth/Throat:     Mouth: Mucous membranes are moist.     Pharynx: Oropharynx is clear.  Eyes:     Extraocular Movements: Extraocular movements intact.     Conjunctiva/sclera: Conjunctivae normal.     Pupils: Pupils are equal, round, and reactive to light.  Cardiovascular:     Rate and Rhythm: Normal rate and regular rhythm.     Pulses: Normal pulses.     Heart sounds: Normal heart sounds.  Pulmonary:     Effort: Pulmonary effort is normal.     Breath sounds: No wheezing, rhonchi or rales.  Abdominal:     General: Abdomen is flat.     Palpations: Abdomen is soft.  Musculoskeletal:        General: Normal range of motion.     Cervical back: Normal range of motion and neck supple.     Comments: Right Thumb: TTP over DIP and IP, with soft tissue swelling noted, LROM with flexion/extension  Skin:    General: Skin is warm and dry.  Neurological:     General: No focal deficit present.     Mental Status: She is alert and oriented to person, place, and time. Mental status is at baseline.  Psychiatric:        Mood and Affect: Mood normal.        Behavior: Behavior normal.        Thought Content: Thought content normal.     UC Treatments / Results  Labs (all labs ordered are listed, but only abnormal results are displayed) Labs Reviewed - No data to display  EKG   Radiology DG Hand Complete Right  Result Date: 06/17/2021 CLINICAL DATA:  Worsening right thumb pain for 2 weeks EXAM: RIGHT HAND - COMPLETE 3+ VIEW COMPARISON:  None. FINDINGS: There is no acute fracture or dislocation. Alignment is normal. The joint spaces are preserved. The soft tissues are unremarkable. There is no radiopaque foreign body. IMPRESSION: Unremarkable hand radiographs. Electronically Signed   By: Valetta Mole M.D.   On: 06/17/2021 13:21    Procedures Procedures (including critical care time)  Medications Ordered in UC Medications  methylPREDNISolone acetate (DEPO-MEDROL) injection  80 mg (80 mg Intramuscular Given 06/17/21 1400)    Initial Impression / Assessment and Plan / UC  Course  I have reviewed the triage vital signs and the nursing notes.  Pertinent labs & imaging results that were available during my care of the patient were reviewed by me and considered in my medical decision making (see chart for details).     MDM: 1. Right thumb pain-x-ray of right hand reveals no acute abnormality; 2.  Strain of intrinsic muscle of right thumb-IM Depo-Medrol 80 mg given once in clinic today, Rx'd Medrol Dosepak. Advised patient to start Medrol Dosepak tomorrow morning, Tuesday, 06/18/2021.  Advised patient to wear right thumb spica brace for 2 weeks 24/7 (except with bathing).  Advised patient to take 1 week to rest right thumb and to avoid repetitive motion activities involving right hand and right thumb.  Patient discharged home, hemodynamically stable. Final Clinical Impressions(s) / UC Diagnoses   Final diagnoses:  Thumb pain, right  Strain of intrinsic muscle of right thumb     Discharge Instructions      Advised patient to start Medrol Dosepak tomorrow morning, Tuesday, 06/18/2021.  Advised patient to wear right thumb spica brace for 2 weeks 24/7 (except with bathing).  Advised patient to take 1 week to rest right thumb and to avoid repetitive motion activities involving right hand and right thumb.     ED Prescriptions     Medication Sig Dispense Auth. Provider   methylPREDNISolone (MEDROL DOSEPAK) 4 MG TBPK tablet Take as directed. 1 each Eliezer Lofts, FNP      PDMP not reviewed this encounter.   Eliezer Lofts, [place] 06/17/21 1406

## 2021-06-17 NOTE — ED Notes (Signed)
Pt reports hx of low heart rate, but states she does not know why and has not seen a cardiologist, although she has had a normal EKG or echocardiogram (unsure which) in the past. Pt is asymptomatic w/ HR of 44. Kathi Ludwig, FNP notified.

## 2021-08-13 ENCOUNTER — Other Ambulatory Visit: Payer: Self-pay | Admitting: Medical-Surgical

## 2021-08-13 DIAGNOSIS — Z1231 Encounter for screening mammogram for malignant neoplasm of breast: Secondary | ICD-10-CM

## 2021-08-21 ENCOUNTER — Ambulatory Visit (INDEPENDENT_AMBULATORY_CARE_PROVIDER_SITE_OTHER): Payer: BC Managed Care – PPO

## 2021-08-21 ENCOUNTER — Other Ambulatory Visit: Payer: Self-pay

## 2021-08-21 DIAGNOSIS — Z1231 Encounter for screening mammogram for malignant neoplasm of breast: Secondary | ICD-10-CM

## 2021-12-10 ENCOUNTER — Other Ambulatory Visit: Payer: Self-pay | Admitting: Medical-Surgical

## 2021-12-12 ENCOUNTER — Telehealth: Payer: Self-pay

## 2021-12-12 NOTE — Telephone Encounter (Signed)
Patient aware that she is due for an appt to continue to fill meds.

## 2021-12-12 NOTE — Telephone Encounter (Signed)
-----   Message from [place] sent at 12/12/2021  8:00 AM EST ----- Regarding: RE: needs appt with [name] Patient said she would call back at a later time to schedule an appointment. AM  ----- Message ----- From: Dema Severin, CMA Sent: 12/11/2021   4:45 PM EST To: [facility] Admin Pool Subject: needs appt with [name]                            March will be a year since she has been seen. Will need an appt to continue to fill meds.   Caroline Silva

## 2022-01-09 ENCOUNTER — Other Ambulatory Visit: Payer: Self-pay | Admitting: Medical-Surgical

## 2022-01-09 NOTE — Telephone Encounter (Signed)
Msg to front desk to call patient to schedule appt.  ?

## 2022-03-19 DIAGNOSIS — H401133 Primary open-angle glaucoma, bilateral, severe stage: Secondary | ICD-10-CM | POA: Diagnosis not present

## 2022-03-19 DIAGNOSIS — Z961 Presence of intraocular lens: Secondary | ICD-10-CM | POA: Diagnosis not present

## 2022-03-19 DIAGNOSIS — H2511 Age-related nuclear cataract, right eye: Secondary | ICD-10-CM | POA: Diagnosis not present

## 2022-04-16 DIAGNOSIS — H401133 Primary open-angle glaucoma, bilateral, severe stage: Secondary | ICD-10-CM | POA: Diagnosis not present

## 2022-04-16 DIAGNOSIS — Z9889 Other specified postprocedural states: Secondary | ICD-10-CM | POA: Diagnosis not present

## 2022-08-18 DIAGNOSIS — Z961 Presence of intraocular lens: Secondary | ICD-10-CM | POA: Diagnosis not present

## 2022-08-18 DIAGNOSIS — H401133 Primary open-angle glaucoma, bilateral, severe stage: Secondary | ICD-10-CM | POA: Diagnosis not present

## 2022-08-18 DIAGNOSIS — H2511 Age-related nuclear cataract, right eye: Secondary | ICD-10-CM | POA: Diagnosis not present

## 2022-09-26 ENCOUNTER — Other Ambulatory Visit: Payer: Self-pay | Admitting: Medical-Surgical

## 2022-09-26 DIAGNOSIS — Z1231 Encounter for screening mammogram for malignant neoplasm of breast: Secondary | ICD-10-CM

## 2022-10-01 ENCOUNTER — Ambulatory Visit (INDEPENDENT_AMBULATORY_CARE_PROVIDER_SITE_OTHER): Payer: BC Managed Care – PPO

## 2022-10-01 DIAGNOSIS — Z1231 Encounter for screening mammogram for malignant neoplasm of breast: Secondary | ICD-10-CM | POA: Diagnosis not present

## 2022-11-20 DIAGNOSIS — K2100 Gastro-esophageal reflux disease with esophagitis, without bleeding: Secondary | ICD-10-CM | POA: Diagnosis not present

## 2022-11-20 DIAGNOSIS — R911 Solitary pulmonary nodule: Secondary | ICD-10-CM | POA: Diagnosis not present

## 2022-11-20 DIAGNOSIS — E7800 Pure hypercholesterolemia, unspecified: Secondary | ICD-10-CM | POA: Diagnosis not present

## 2022-11-20 DIAGNOSIS — H44519 Absolute glaucoma, unspecified eye: Secondary | ICD-10-CM | POA: Diagnosis not present

## 2022-11-20 DIAGNOSIS — R7303 Prediabetes: Secondary | ICD-10-CM | POA: Diagnosis not present

## 2022-11-20 DIAGNOSIS — R531 Weakness: Secondary | ICD-10-CM | POA: Diagnosis not present

## 2022-12-08 DIAGNOSIS — H401133 Primary open-angle glaucoma, bilateral, severe stage: Secondary | ICD-10-CM | POA: Diagnosis not present

## 2023-01-16 DIAGNOSIS — R911 Solitary pulmonary nodule: Secondary | ICD-10-CM | POA: Diagnosis not present

## 2023-02-22 DIAGNOSIS — Z0130 Encounter for examination of blood pressure without abnormal findings: Secondary | ICD-10-CM | POA: Diagnosis not present

## 2023-02-25 DIAGNOSIS — Z961 Presence of intraocular lens: Secondary | ICD-10-CM | POA: Diagnosis not present

## 2023-02-25 DIAGNOSIS — H2511 Age-related nuclear cataract, right eye: Secondary | ICD-10-CM | POA: Diagnosis not present

## 2023-02-25 DIAGNOSIS — H401133 Primary open-angle glaucoma, bilateral, severe stage: Secondary | ICD-10-CM | POA: Diagnosis not present

## 2023-03-15 DIAGNOSIS — R030 Elevated blood-pressure reading, without diagnosis of hypertension: Secondary | ICD-10-CM | POA: Diagnosis not present

## 2023-03-15 DIAGNOSIS — R1013 Epigastric pain: Secondary | ICD-10-CM | POA: Diagnosis not present

## 2023-06-09 ENCOUNTER — Ambulatory Visit: Payer: BC Managed Care – PPO | Admitting: Sports Medicine

## 2023-06-09 ENCOUNTER — Ambulatory Visit (INDEPENDENT_AMBULATORY_CARE_PROVIDER_SITE_OTHER): Payer: BC Managed Care – PPO

## 2023-06-09 DIAGNOSIS — M67432 Ganglion, left wrist: Secondary | ICD-10-CM

## 2023-06-09 DIAGNOSIS — S46011S Strain of muscle(s) and tendon(s) of the rotator cuff of right shoulder, sequela: Secondary | ICD-10-CM

## 2023-06-09 DIAGNOSIS — M19011 Primary osteoarthritis, right shoulder: Secondary | ICD-10-CM | POA: Diagnosis not present

## 2023-06-09 DIAGNOSIS — M6740 Ganglion, unspecified site: Secondary | ICD-10-CM | POA: Diagnosis not present

## 2023-06-09 DIAGNOSIS — S43001A Unspecified subluxation of right shoulder joint, initial encounter: Secondary | ICD-10-CM | POA: Diagnosis not present

## 2023-06-09 MED ORDER — MELOXICAM 15 MG PO TABS: ORAL_TABLET | ORAL | 3 refills | Status: DC

## 2023-06-09 NOTE — Assessment & Plan Note (Signed)
Pleasant 67 year old female, I saw her about 4 years ago, she had some chronic shoulder pain, ultimately due to MRI claustrophobia we obtained CT arthrography, it showed full-thickness supraspinatus and infraspinatus tearing with retractions, her shoulder pain improved likely due to the steroid and the injection, and she did not return. Now she is having worsening of pain right shoulder worse with abduction. She does have a job that requires heavy lifting. She is requesting restrictions, I am happy to give her some temporary restrictions, I would also like to help her get her shoulder better and back to her previous level of function, we will do x-rays, meloxicam, formal physical therapy. Return to see me in 6 weeks, and injection to the glenohumeral joint if not better.

## 2023-06-09 NOTE — Assessment & Plan Note (Signed)
Tender volar ganglion, likely in association with the flexor carpi radialis. She will get an over-the-counter Velcro brace, meloxicam, x-rays, return to see me if not better in 6 weeks. If persistent discomfort we will do an aspiration.

## 2023-06-09 NOTE — Progress Notes (Signed)
    Procedures performed today:    None.  Independent interpretation of notes and tests performed by another provider:   None.  Brief History, Exam, Impression, and Recommendations:    Rotator cuff tear, right Pleasant 67 year old female, I saw her about 4 years ago, she had some chronic shoulder pain, ultimately due to MRI claustrophobia we obtained CT arthrography, it showed full-thickness supraspinatus and infraspinatus tearing with retractions, her shoulder pain improved likely due to the steroid and the injection, and she did not return. Now she is having worsening of pain right shoulder worse with abduction. She does have a job that requires heavy lifting. She is requesting restrictions, I am happy to give her some temporary restrictions, I would also like to help her get her shoulder better and back to her previous level of function, we will do x-rays, meloxicam, formal physical therapy. Return to see me in 6 weeks, and injection to the glenohumeral joint if not better.  Ganglion cyst of volar aspect of left wrist Tender volar ganglion, likely in association with the flexor carpi radialis. She will get an over-the-counter Velcro brace, meloxicam, x-rays, return to see me if not better in 6 weeks. If persistent discomfort we will do an aspiration.    ____________________________________________ Ihor Austin. Benjamin Stain, M.D., ABFM., CAQSM., AME. Primary Care and Sports Medicine [facility]  Adjunct Professor of Family Medicine  [place] [employer] [employer]  Restaurant manager, fast food

## 2023-06-30 DIAGNOSIS — L811 Chloasma: Secondary | ICD-10-CM | POA: Diagnosis not present

## 2023-07-08 DIAGNOSIS — Z961 Presence of intraocular lens: Secondary | ICD-10-CM | POA: Diagnosis not present

## 2023-07-08 DIAGNOSIS — H401133 Primary open-angle glaucoma, bilateral, severe stage: Secondary | ICD-10-CM | POA: Diagnosis not present

## 2023-07-08 DIAGNOSIS — H2511 Age-related nuclear cataract, right eye: Secondary | ICD-10-CM | POA: Diagnosis not present

## 2023-07-21 ENCOUNTER — Ambulatory Visit: Payer: BC Managed Care – PPO | Admitting: Sports Medicine

## 2023-07-27 DIAGNOSIS — R7989 Other specified abnormal findings of blood chemistry: Secondary | ICD-10-CM | POA: Diagnosis not present

## 2023-07-27 DIAGNOSIS — Z79899 Other long term (current) drug therapy: Secondary | ICD-10-CM | POA: Diagnosis not present

## 2023-07-27 DIAGNOSIS — R7303 Prediabetes: Secondary | ICD-10-CM | POA: Diagnosis not present

## 2023-07-27 DIAGNOSIS — R42 Dizziness and giddiness: Secondary | ICD-10-CM | POA: Diagnosis not present

## 2023-07-27 DIAGNOSIS — R778 Other specified abnormalities of plasma proteins: Secondary | ICD-10-CM | POA: Diagnosis not present

## 2023-07-27 DIAGNOSIS — R030 Elevated blood-pressure reading, without diagnosis of hypertension: Secondary | ICD-10-CM | POA: Diagnosis not present

## 2023-07-27 DIAGNOSIS — R110 Nausea: Secondary | ICD-10-CM | POA: Diagnosis not present

## 2023-07-27 DIAGNOSIS — Z8669 Personal history of other diseases of the nervous system and sense organs: Secondary | ICD-10-CM | POA: Diagnosis not present

## 2023-07-27 DIAGNOSIS — I10 Essential (primary) hypertension: Secondary | ICD-10-CM | POA: Diagnosis not present

## 2023-07-27 DIAGNOSIS — R001 Bradycardia, unspecified: Secondary | ICD-10-CM | POA: Diagnosis not present

## 2023-07-27 DIAGNOSIS — R61 Generalized hyperhidrosis: Secondary | ICD-10-CM | POA: Diagnosis not present

## 2023-07-27 DIAGNOSIS — Z888 Allergy status to other drugs, medicaments and biological substances status: Secondary | ICD-10-CM | POA: Diagnosis not present

## 2023-07-27 DIAGNOSIS — J9811 Atelectasis: Secondary | ICD-10-CM | POA: Diagnosis not present

## 2023-07-27 DIAGNOSIS — R55 Syncope and collapse: Secondary | ICD-10-CM | POA: Diagnosis not present

## 2023-07-27 DIAGNOSIS — E785 Hyperlipidemia, unspecified: Secondary | ICD-10-CM | POA: Diagnosis not present

## 2023-07-28 DIAGNOSIS — R42 Dizziness and giddiness: Secondary | ICD-10-CM | POA: Diagnosis not present

## 2023-07-28 DIAGNOSIS — R7989 Other specified abnormal findings of blood chemistry: Secondary | ICD-10-CM | POA: Diagnosis not present

## 2023-07-28 DIAGNOSIS — R030 Elevated blood-pressure reading, without diagnosis of hypertension: Secondary | ICD-10-CM | POA: Diagnosis not present

## 2023-07-28 DIAGNOSIS — M546 Pain in thoracic spine: Secondary | ICD-10-CM | POA: Diagnosis not present

## 2023-07-29 ENCOUNTER — Telehealth: Payer: Self-pay

## 2023-07-29 NOTE — Transitions of Care (Post Inpatient/ED Visit) (Unsigned)
   07/29/2023  Name: Caroline Silva MRN: [id number] DOB: November 24, 1955  Today's TOC FU Call Status: Today's TOC FU Call Status:: Unsuccessful Call (1st Attempt) Unsuccessful Call (1st Attempt) Date: 07/29/23  Attempted to reach the patient regarding the most recent Inpatient/ED visit.  Follow Up Plan: Additional outreach attempts will be made to reach the patient to complete the Transitions of Care (Post Inpatient/ED visit) call.   Signature Karena Addison, LPN [facility] Nurse Health Advisor Direct Dial [phone]

## 2023-07-30 NOTE — Transitions of Care (Post Inpatient/ED Visit) (Signed)
   07/30/2023  Name: Caroline Silva MRN: [id number] DOB: [birth year]  Today's TOC FU Call Status: Today's TOC FU Call Status:: Successful TOC FU Call Completed Unsuccessful Call (1st Attempt) Date: 07/29/23 [facility] FU Call Complete Date: 07/30/23 Patient's Name and Date of Birth confirmed.  Transition Care Management Follow-up Telephone Call Date of Discharge: 07/28/23 Discharge Facility: Other ([facility]) Name of Other ([facility]) Discharge Facility: [facility] Type of Discharge: Inpatient Admission Primary Inpatient Discharge Diagnosis:: dizziness How have you been since you were released from the hospital?: Better Any questions or concerns?: No  Items Reviewed: Did you receive and understand the discharge instructions provided?: Yes Medications obtained,verified, and reconciled?: Yes (Medications Reviewed) Any new allergies since your discharge?: No Dietary orders reviewed?: Yes Do you have support at home?: No  Medications Reviewed Today: Medications Reviewed Today     Reviewed by Karena Addison, LPN (Licensed Practical Nurse) on 07/30/23 at 1544  Med List Status: <None>   Medication Order Taking? Sig Documenting Provider Last Dose Status Informant  amitriptyline (ELAVIL) 25 MG tablet [id number] No Take 1 tablet (25 mg total) by mouth at bedtime for 7 days, THEN 2 tablets (50 mg total) at bedtime for 23 days. Christen Butter, NP Taking Active   brimonidine-timolol (COMBIGAN) 0.2-0.5 % ophthalmic solution [id number] No Place 1 drop into both eyes 2 (two) times daily. [provider] Taking Active   brinzolamide (AZOPT) 1 % ophthalmic suspension [id number] No Place 1 drop into both eyes in the morning, at noon, and at bedtime. [provider] Taking Active   latanoprost (XALATAN) 0.005 % ophthalmic solution [id number] No Apply 1 drop to eye at bedtime. [provider] Taking Active   meloxicam (MOBIC) 15 MG tablet [id number]  One tab PO every 24  hours with a meal for 2 weeks, then once every 24 hours prn pain. Monica Becton, MD  Active   methylPREDNISolone (MEDROL DOSEPAK) 4 MG TBPK tablet [id number] No Take as directed. Trevor Iha, FNP Taking Active   Multiple Vitamins-Minerals (MULTIVITAMIN ADULT PO) [id number] No Take 1 tablet by mouth daily.  [provider] Taking Active             Home Care and Equipment/Supplies: Were Home Health Services Ordered?: NA Any new equipment or medical supplies ordered?: NA  Functional Questionnaire: Do you need assistance with bathing/showering or dressing?: No Do you need assistance with meal preparation?: No Do you need assistance with eating?: No Do you have difficulty maintaining continence: No Do you need assistance with getting out of bed/getting out of a chair/moving?: No Do you have difficulty managing or taking your medications?: No  Follow up appointments reviewed: PCP Follow-up appointment confirmed?: Yes Date of PCP follow-up appointment?: 08/10/23 Follow-up Provider: [facility] Follow-up appointment confirmed?: NA Do you need transportation to your follow-up appointment?: No Do you understand care options if your condition(s) worsen?: Yes-patient verbalized understanding    SIGNATURE Karena Addison, LPN [facility] Nurse Health Advisor Direct Dial [phone]

## 2023-08-10 ENCOUNTER — Ambulatory Visit (INDEPENDENT_AMBULATORY_CARE_PROVIDER_SITE_OTHER): Payer: BC Managed Care – PPO | Admitting: Medical-Surgical

## 2023-08-10 ENCOUNTER — Encounter: Payer: Self-pay | Admitting: Medical-Surgical

## 2023-08-10 VITALS — BP 129/74 | HR 84 | Resp 20 | Ht 66.0 in | Wt 174.1 lb

## 2023-08-10 DIAGNOSIS — R0683 Snoring: Secondary | ICD-10-CM | POA: Diagnosis not present

## 2023-08-10 DIAGNOSIS — I517 Cardiomegaly: Secondary | ICD-10-CM

## 2023-08-10 DIAGNOSIS — Z09 Encounter for follow-up examination after completed treatment for conditions other than malignant neoplasm: Secondary | ICD-10-CM | POA: Diagnosis not present

## 2023-08-10 NOTE — Progress Notes (Unsigned)
        Established patient visit  History, exam, impression, and plan:  1. Hospital discharge follow-up Very pleasant 67 year old female presenting today for hospital discharge follow-up.  She is accompanied by a professional interpreter, Vernona Rieger, who assists with translation needs.  The patient was evaluated on 07/27/2023 at [facility] ED for complaints of dizziness.  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  She had some tightness in between her shoulder blades as well as the development of dizziness.  She was sent to the ED via EMS for further evaluation.  Her workup was benign and they were unable to pinpoint a cause.  She did have some elevated troponin levels however these were flat and her symptoms were not suspected to be cardiovascular in nature.  Since this episode, she has had no further issues with dizziness or easily overheating.  Denies any current concerns and hemodynamically stable today.  Advised monitoring for any repeat symptoms and seeking prompt evaluation should this happen.  Patient verbalized understanding is agreeable to the plan.  2. Snoring Of note, she does have a history of loud snoring.  She is not currently interested in doing a sleep study but would like to reevaluate this at the beginning of the year since she will have some insurance changes.  3. Left atrial enlargement She does have a history of left atrial enlargement.  At the hospital, there was an initial suspicion of cardiac etiology however this was ruled out.  Her EKG was unremarkable.  On review, her last echocardiogram was in 2018 with a recommended repeat in 1 year however this was not completed.  Ordering echocardiogram for update today.  She would likely schedule this after the beginning of the year which is perfectly fine. - ECHOCARDIOGRAM COMPLETE; Future   Procedures performed this visit: None.  Return in about 1 year (around 08/09/2024) for annual physical exam  or sooner if needed.  __________________________________ Thayer Ohm, DNP, APRN, FNP-BC Primary Care and Sports Medicine [facility] [place]

## 2023-08-20 DIAGNOSIS — H401133 Primary open-angle glaucoma, bilateral, severe stage: Secondary | ICD-10-CM | POA: Diagnosis not present

## 2023-08-27 ENCOUNTER — Ambulatory Visit
Admission: EM | Admit: 2023-08-27 | Discharge: 2023-08-27 | Disposition: A | Discharge status: Home / Self Care | Payer: BC Managed Care – PPO | Attending: Family Medicine | Admitting: Family Medicine

## 2023-08-27 ENCOUNTER — Other Ambulatory Visit: Payer: Self-pay

## 2023-08-27 DIAGNOSIS — R300 Dysuria: Secondary | ICD-10-CM | POA: Diagnosis not present

## 2023-08-27 LAB — POCT URINALYSIS DIP (MANUAL ENTRY)
Bilirubin, UA: NEGATIVE
Blood, UA: NEGATIVE
Glucose, UA: NEGATIVE mg/dL
Ketones, POC UA: NEGATIVE mg/dL
Leukocytes, UA: NEGATIVE
Nitrite, UA: NEGATIVE
Protein Ur, POC: NEGATIVE mg/dL
Spec Grav, UA: 1.02 (ref 1.010–1.025)
Urobilinogen, UA: 1 U/dL
pH, UA: 8.5 — AB (ref 5.0–8.0)

## 2023-08-27 MED ORDER — PHENAZOPYRIDINE HCL 200 MG PO TABS: 200.0000 mg | ORAL_TABLET | Freq: Every day | ORAL | 0 refills | Status: DC

## 2023-08-27 MED ORDER — FLUCONAZOLE 150 MG PO TABS: 150.0000 mg | ORAL_TABLET | Freq: Every day | ORAL | 0 refills | Status: AC

## 2023-08-27 NOTE — ED Triage Notes (Signed)
Couple of weeks has had painful urination. No fever.

## 2023-08-27 NOTE — ED Provider Notes (Signed)
[facility]    CSN: [id number] Arrival date & time: 08/27/23  1804      History   Chief Complaint Chief Complaint  Patient presents with   Dysuria    HPI Caroline Silva is a 67 y.o. female.   HPI  Dysuria for 2 weeks No pelvic or abdominal pain No flank pain No NVD No fever No hematuria No recurrent UTIs  Past Medical History:  Diagnosis Date   Cataract    Glaucoma    Heart murmur    Hypertriglyceridemia without hypercholesterolemia 02/17/2017   Obesity    Prediabetes 06/30/2012    Patient Active Problem List   Diagnosis Date Noted   Ganglion cyst of volar aspect of left wrist 06/09/2023   Anxiety and depression 01/15/2021   Insomnia 01/15/2021   Candidiasis of skin 10/24/2019   Rotator cuff tear, right 06/10/2018   Dermoid cyst of scalp 10/21/2017   Class 1 obesity due to excess calories without serious comorbidity with body mass index (BMI) of 30.0 to 30.9 in adult 02/17/2017   Hypertriglyceridemia without hypercholesterolemia 02/17/2017   Systolic ejection murmur 02/13/2017   Onychomycosis 02/13/2017   Left shoulder pain 11/06/2015   HPV test positive 01/26/2014   Prediabetes 06/30/2012    Past Surgical History:  Procedure Laterality Date   GLAUCOMA SURGERY      OB History   No obstetric history on file.      Home Medications    Prior to Admission medications   Medication Sig Start Date End Date Taking? Authorizing Provider  fluconazole (DIFLUCAN) 150 MG tablet Take 1 tablet (150 mg total) by mouth daily. Repeat in 1 week if needed 08/27/23  Yes Eustace Moore, MD  phenazopyridine (PYRIDIUM) 200 MG tablet Take 1 tablet (200 mg total) by mouth at bedtime. For urine pain 08/27/23  Yes Eustace Moore, MD  brimonidine-timolol (COMBIGAN) 0.2-0.5 % ophthalmic solution Place 1 drop into both eyes 2 (two) times daily. 04/12/15   [provider]  brinzolamide (AZOPT) 1 % ophthalmic suspension Place 1 drop into both eyes  in the morning, at noon, and at bedtime. 11/16/19   [provider]  latanoprost (XALATAN) 0.005 % ophthalmic solution Apply 1 drop to eye at bedtime. 03/23/20   [provider]    Family History Family History  Problem Relation Age of Onset   Diabetes Mother    GI Bleed Father    Diabetes Brother    Cancer Neg Hx    Heart attack Neg Hx    Stroke Neg Hx     Social History Social History   Tobacco Use   Smoking status: Never   Smokeless tobacco: Never  Vaping Use   Vaping status: Never Used  Substance Use Topics   Alcohol use: No   Drug use: No     Allergies   Bimatoprost   Review of Systems Review of Systems  See HPI Physical Exam Triage Vital Signs ED Triage Vitals  Encounter Vitals Group     BP 08/27/23 1812 114/74     Systolic BP Percentile --      Diastolic BP Percentile --      Pulse Rate 08/27/23 1812 67     Resp 08/27/23 1812 16     Temp 08/27/23 1812 98.5 F (36.9 C)     Temp src --      SpO2 08/27/23 1812 99 %     Weight --      Height --  Head Circumference --      Peak Flow --      Pain Score 08/27/23 1815 2     Pain Loc --      Pain Education --      Exclude from Growth Chart --    No data found.  Updated Vital Signs BP 114/74   Pulse 67   Temp 98.5 F (36.9 C)   Resp 16   SpO2 99%      Physical Exam Constitutional:      General: She is not in acute distress.    Appearance: She is well-developed.  HENT:     Head: Normocephalic and atraumatic.  Eyes:     Conjunctiva/sclera: Conjunctivae normal.     Pupils: Pupils are equal, round, and reactive to light.  Cardiovascular:     Rate and Rhythm: Normal rate.  Pulmonary:     Effort: Pulmonary effort is normal. No respiratory distress.  Abdominal:     General: There is no distension.     Palpations: Abdomen is soft.     Tenderness: There is no abdominal tenderness. There is no right CVA tenderness or left CVA tenderness.  Musculoskeletal:        General:  Normal range of motion.     Cervical back: Normal range of motion.  Skin:    General: Skin is warm and dry.  Neurological:     Mental Status: She is alert.      UC Treatments / Results  Labs (all labs ordered are listed, but only abnormal results are displayed) Labs Reviewed  POCT URINALYSIS DIP (MANUAL ENTRY) - Abnormal; Notable for the following components:      Result Value   pH, UA 8.5 (*)    All other components within normal limits  URINE CULTURE    EKG   Radiology No results found.  Procedures Procedures (including critical care time)  Medications Ordered in UC Medications - No data to display  Initial Impression / Assessment and Plan / UC Course  I have reviewed the triage vital signs and the nursing notes.  Pertinent labs & imaging results that were available during my care of the patient were reviewed by me and considered in my medical decision making (see chart for details).     No evidence infection culture sent based on symptoms Discussed beverages and foods Discussed perfumes and products Discussed postmenopausal atrophic changes She states it started with vaginal itching last week, so will give a diflucan Final Clinical Impressions(s) / UC Diagnoses   Final diagnoses:  Dysuria     Discharge Instructions      Continue to drink lots of water Avoid carbonated drinks and caffeine Take the diflucan to treat any yeast infection May take pyridium at night to reduce urine pain It turns urine ORANGE See Dr Larinda Buttery if the discomfort continues   ED Prescriptions     Medication Sig Dispense Auth. Provider   fluconazole (DIFLUCAN) 150 MG tablet Take 1 tablet (150 mg total) by mouth daily. Repeat in 1 week if needed 2 tablet Eustace Moore, MD   phenazopyridine (PYRIDIUM) 200 MG tablet Take 1 tablet (200 mg total) by mouth at bedtime. For urine pain 6 tablet Eustace Moore, MD      PDMP not reviewed this encounter.   Eustace Moore, MD 08/27/23 [phone]

## 2023-08-27 NOTE — Discharge Instructions (Addendum)
Continue to drink lots of water Avoid carbonated drinks and caffeine Take the diflucan to treat any yeast infection May take pyridium at night to reduce urine pain It turns urine ORANGE See Dr Larinda Buttery if the discomfort continues

## 2023-08-29 LAB — URINE CULTURE: Culture: NO GROWTH

## 2023-09-14 ENCOUNTER — Ambulatory Visit (HOSPITAL_COMMUNITY): Payer: BC Managed Care – PPO | Attending: Medical-Surgical

## 2023-09-15 ENCOUNTER — Encounter (HOSPITAL_COMMUNITY): Payer: Self-pay | Admitting: Medical-Surgical

## 2023-10-05 DIAGNOSIS — H401133 Primary open-angle glaucoma, bilateral, severe stage: Secondary | ICD-10-CM | POA: Diagnosis not present

## 2023-10-05 DIAGNOSIS — H2511 Age-related nuclear cataract, right eye: Secondary | ICD-10-CM | POA: Diagnosis not present

## 2023-10-05 DIAGNOSIS — Z961 Presence of intraocular lens: Secondary | ICD-10-CM | POA: Diagnosis not present

## 2024-01-29 ENCOUNTER — Ambulatory Visit: Payer: Self-pay | Admitting: *Deleted

## 2024-01-29 NOTE — Telephone Encounter (Signed)
  Chief Complaint: dizziness, diarrhea  Symptoms: dizziness when standing , can work . Balance off at times. Diarrhea x 2 today . No other sx reported Frequency: 1-2 weeks Pertinent Negatives: Patient denies chest pain no difficulty breathing no fever no headache reported.  Disposition: [] ED /[] Urgent Care (no appt availability in office) / [x] Appointment(In office/virtual)/ []  [facility]/ [] Home Care/ [] Refused Recommended Disposition /[] [facility]/ []  Follow-up with PCP Additional Notes:   Appt scheduled for 02/01/24. Recommended increase water / fluids. Rise slow call back or go get evaluated if sx worsen over weekend.      Copied from CRM [id number]. Topic: Clinical - Red Word Triage >> Jan 29, 2024  9:37 AM Everette Rank wrote: Red Word that prompted transfer to Nurse Triage: Dizzy in morning when laying down or statnding for 1-2 weeks. Happend last night as well. Reason for Disposition  [1] MILD dizziness (e.g., walking normally) AND [2] has NOT been evaluated by doctor (or NP/PA) for this  (Exception: Dizziness caused by heat exposure, sudden standing, or poor fluid intake.)  Answer Assessment - Initial Assessment Questions 1. DESCRIPTION: "Describe your dizziness."     lightheaded 2. LIGHTHEADED: "Do you feel lightheaded?" (e.g., somewhat faint, woozy, weak upon standing)     Dizziness with standing  3. VERTIGO: "Do you feel like either you or the room is spinning or tilting?" (i.e. vertigo)     na 4. SEVERITY: "How bad is it?"  "Do you feel like you are going to faint?" "Can you stand and walk?"   - MILD: Feels slightly dizzy, but walking normally.   - MODERATE: Feels unsteady when walking, but not falling; interferes with normal activities (e.g., school, work).   - SEVERE: Unable to walk without falling, or requires assistance to walk without falling; feels like passing out now.      Mild  5. ONSET:  "When did the dizziness begin?"     1-2 weeks  6. AGGRAVATING  FACTORS: "Does anything make it worse?" (e.g., standing, change in head position)     Unsure  7. HEART RATE: "Can you tell me your heart rate?" "How many beats in 15 seconds?"  (Note: not all patients can do this)       Na  8. CAUSE: "What do you think is causing the dizziness?"     Not sure  9. RECURRENT SYMPTOM: "Have you had dizziness before?" If Yes, ask: "When was the last time?" "What happened that time?"     Couple years ago  10. OTHER SYMPTOMS: "Do you have any other symptoms?" (e.g., fever, chest pain, vomiting, diarrhea, bleeding)       Diarrhea x 2 this am  11. PREGNANCY: "Is there any chance you are pregnant?" "When was your last menstrual period?"       na  Protocols used: Dizziness - Lightheadedness-A-AH

## 2024-02-01 ENCOUNTER — Ambulatory Visit: Admitting: Medical-Surgical

## 2024-03-04 ENCOUNTER — Ambulatory Visit
Admission: EM | Admit: 2024-03-04 | Discharge: 2024-03-04 | Disposition: A | Discharge status: Home / Self Care | Attending: Family Medicine | Admitting: Family Medicine

## 2024-03-04 DIAGNOSIS — J209 Acute bronchitis, unspecified: Secondary | ICD-10-CM

## 2024-03-04 MED ORDER — DOXYCYCLINE HYCLATE 100 MG PO CAPS: ORAL_CAPSULE | ORAL | 0 refills | Status: DC

## 2024-03-04 NOTE — ED Provider Notes (Signed)
 [facility]    CSN: [id number] Arrival date & time: 03/04/24  1056      History   Chief Complaint Chief Complaint  Patient presents with   Cough    HPI Caroline Silva is a 68 y.o. female.   Patient developed a cough six days ago and has now developed discomfort in her anterior chest when coughing.  She has had mild sore throat but no nasal congestion. She has not had fever and denies shortness of breath.  The history is provided by the patient.    Past Medical History:  Diagnosis Date   Cataract    Glaucoma    Heart murmur    Hypertriglyceridemia without hypercholesterolemia 02/17/2017   Obesity    Prediabetes 06/30/2012    Patient Active Problem List   Diagnosis Date Noted   Ganglion cyst of volar aspect of left wrist 06/09/2023   Anxiety and depression 01/15/2021   Insomnia 01/15/2021   Candidiasis of skin 10/24/2019   Rotator cuff tear, right 06/10/2018   Dermoid cyst of scalp 10/21/2017   Class 1 obesity due to excess calories without serious comorbidity with body mass index (BMI) of 30.0 to 30.9 in adult 02/17/2017   Hypertriglyceridemia without hypercholesterolemia 02/17/2017   Systolic ejection murmur 02/13/2017   Onychomycosis 02/13/2017   Left shoulder pain 11/06/2015   HPV test positive 01/26/2014   Prediabetes 06/30/2012    Past Surgical History:  Procedure Laterality Date   GLAUCOMA SURGERY      OB History   No obstetric history on file.      Home Medications    Prior to Admission medications   Medication Sig Start Date End Date Taking? Authorizing Provider  doxycycline  (VIBRAMYCIN ) 100 MG capsule Take one cap PO Q12hr with food. 03/04/24  Yes Leon Rajas, MD  brimonidine-timolol (COMBIGAN) 0.2-0.5 % ophthalmic solution Place 1 drop into both eyes 2 (two) times daily. 04/12/15   [provider]  brinzolamide (AZOPT) 1 % ophthalmic suspension Place 1 drop into both eyes in the morning, at noon, and at bedtime. 11/16/19    [provider]  fluconazole  (DIFLUCAN ) 150 MG tablet Take 1 tablet (150 mg total) by mouth daily. Repeat in 1 week if needed 08/27/23   Stephany Ehrich, MD  latanoprost (XALATAN) 0.005 % ophthalmic solution Apply 1 drop to eye at bedtime. 03/23/20   [provider]  phenazopyridine  (PYRIDIUM ) 200 MG tablet Take 1 tablet (200 mg total) by mouth at bedtime. For urine pain 08/27/23   Stephany Ehrich, MD    Family History Family History  Problem Relation Age of Onset   Diabetes Mother    GI Bleed Father    Diabetes Brother    Cancer Neg Hx    Heart attack Neg Hx    Stroke Neg Hx     Social History Social History   Tobacco Use   Smoking status: Never   Smokeless tobacco: Never  Vaping Use   Vaping status: Never Used  Substance Use Topics   Alcohol use: No   Drug use: No     Allergies   Bimatoprost   Review of Systems Review of Systems + sore throat + cough No pleuritic pain No wheezing + nasal congestion + post-nasal drainage No sinus pain/pressure No itchy/red eyes ? earache No hemoptysis No SOB No fever, + chills No nausea No vomiting No abdominal pain No diarrhea No urinary symptoms No skin rash + fatigue No myalgias + headache Used  OTC meds (Alka Seltzer Plus) without relief   Physical Exam Triage Vital Signs ED Triage Vitals  Encounter Vitals Group     BP 03/04/24 1108 130/77     Systolic BP Percentile --      Diastolic BP Percentile --      Pulse Rate 03/04/24 1108 73     Resp 03/04/24 1108 17     Temp 03/04/24 1108 99.3 F (37.4 C)     Temp Source 03/04/24 1108 Oral     SpO2 03/04/24 1108 97 %     Weight --      Height --      Head Circumference --      Peak Flow --      Pain Score 03/04/24 1110 0     Pain Loc --      Pain Education --      Exclude from Growth Chart --    No data found.  Updated Vital Signs BP 130/77 (BP Location: Right Arm)   Pulse 73   Temp 99.3 F (37.4 C) (Oral)   Resp 17   SpO2  97%   Visual Acuity Right Eye Distance:   Left Eye Distance:   Bilateral Distance:    Right Eye Near:   Left Eye Near:    Bilateral Near:     Physical Exam Nursing notes and Vital Signs reviewed. Appearance:  Patient appears stated age, and in no acute distress Eyes:  Pupils are equal, round, and reactive to light and accomodation.  Extraocular movement is intact.  Conjunctivae are not inflamed  Ears:  Canals normal.  Tympanic membranes normal.  Nose:  Mildly congested turbinates.  No sinus tenderness.   Pharynx:  Normal Neck:  Supple.  Mildly enlarged lateral nodes are present, tender to palpation on the left.   Lungs:  Clear to auscultation.  Breath sounds are equal.  Moving air well. Heart:  Regular rate and rhythm without murmurs, rubs, or gallops.  Abdomen:  Nontender without masses or hepatosplenomegaly.  Bowel sounds are present.  No CVA or flank tenderness.  Extremities:  No edema.  Skin:  No rash present.   UC Treatments / Results  Labs (all labs ordered are listed, but only abnormal results are displayed) Labs Reviewed - No data to display  EKG   Radiology No results found.  Procedures Procedures (including critical care time)  Medications Ordered in UC Medications - No data to display  Initial Impression / Assessment and Plan / UC Course  I have reviewed the triage vital signs and the nursing notes.  Pertinent labs & imaging results that were available during my care of the patient were reviewed by me and considered in my medical decision making (see chart for details).    Begin doxycycline  100mg  Q12hr for one week. Followup with Family Doctor if not improved in one week.   Final Clinical Impressions(s) / UC Diagnoses   Final diagnoses:  Acute bronchitis, unspecified organism     Discharge Instructions      Take plain guaifenesin (1200mg  extended release tabs such as Mucinex) twice daily, with plenty of water, for cough and congestion.   Stop  all antihistamines for now, and other non-prescription cough/cold preparations.  If symptoms become significantly worse during the night or over the weekend, proceed to the local emergency room.     ED Prescriptions     Medication Sig Dispense Auth. Provider   doxycycline  (VIBRAMYCIN ) 100 MG capsule Take one cap PO Q12hr with  food. 14 capsule Leon Rajas, MD         Leon Rajas, MD 03/05/24 [phone]

## 2024-03-04 NOTE — ED Triage Notes (Signed)
 Pt c/o cough x 6 days. Now starting to hurt in her chest when she coughs. Alkaseltzer plus, tylenol and lemon tea prn.

## 2024-03-04 NOTE — Discharge Instructions (Signed)
Take plain guaifenesin (1200mg  extended release tabs such as Mucinex) twice daily, with plenty of water, for cough and congestion.   Stop all antihistamines for now, and other non-prescription cough/cold preparations. If symptoms become significantly worse during the night or over the weekend, proceed to the local emergency room.

## 2024-05-31 ENCOUNTER — Telehealth: Payer: Self-pay

## 2024-05-31 NOTE — Telephone Encounter (Signed)
 Copied from CRM [id number]. Topic: Clinical - Request for Lab/Test Order >> May 31, 2024  4:21 PM Brittney F wrote: Reason for CRM:   Patient is calling in to schedule a routine mammogram; PAS did not see a mammogram order in. Please review patient's chart and place mammogram order and contact the patient to schedule.   Callback Number: [phone]

## 2024-06-02 ENCOUNTER — Telehealth: Payer: Self-pay

## 2024-06-02 DIAGNOSIS — Z1231 Encounter for screening mammogram for malignant neoplasm of breast: Secondary | ICD-10-CM

## 2024-06-02 NOTE — Telephone Encounter (Signed)
 Copied from CRM [id number]. Topic: Clinical - Request for Lab/Test Order >> Jun 02, 2024 10:38 AM Farrel B wrote: Reason for CRM: Patient called in regard to getting a mammogram, she stated that she had called on the 5th of August to get it scheduled; I read off notes to her from the provider, stating she needed a physical. Patient stated she didn't a physical she had a physical last month, after looking at past appt, I asked was it last month that she had the physical done, and before I could schedule her for the physical patient disconnected call. I did advise pcp stated she needed to get in to have a physical/wellness apt, but she said no she wanted a mammogram

## 2024-06-02 NOTE — Telephone Encounter (Signed)
 Mammogram ordered

## 2024-06-03 NOTE — Telephone Encounter (Signed)
 Attempted to contact patient but unfortunately was sent to voicemail. I have left a voice message, in spanish, requesting a call back to our office.

## 2024-06-08 NOTE — Telephone Encounter (Signed)
 Attempted call to patient via interpreter # [phone] luz - voice mail message was left requesting a return call with interpreter.

## 2024-06-14 NOTE — Telephone Encounter (Signed)
 Patient informed that can now call to [facility] Mammogram  via interpreter # [phone]- also gave radiology phone # for scheduling.

## 2024-06-30 ENCOUNTER — Encounter: Payer: Self-pay | Admitting: Sports Medicine

## 2024-09-19 ENCOUNTER — Ambulatory Visit: Admitting: Medical-Surgical

## 2024-09-29 ENCOUNTER — Ambulatory Visit

## 2024-09-29 DIAGNOSIS — Z1231 Encounter for screening mammogram for malignant neoplasm of breast: Secondary | ICD-10-CM

## 2024-10-09 ENCOUNTER — Ambulatory Visit: Payer: Self-pay | Admitting: Medical-Surgical

## 2024-11-08 ENCOUNTER — Encounter: Payer: Self-pay | Admitting: Medical-Surgical

## 2024-11-08 ENCOUNTER — Ambulatory Visit: Admitting: Medical-Surgical

## 2024-11-08 VITALS — BP 95/57 | HR 59 | Temp 98.2°F | Resp 20 | Ht 66.0 in | Wt 174.0 lb

## 2024-11-08 DIAGNOSIS — Z780 Asymptomatic menopausal state: Secondary | ICD-10-CM

## 2024-11-08 DIAGNOSIS — Z0000 Encounter for general adult medical examination without abnormal findings: Secondary | ICD-10-CM

## 2024-11-08 DIAGNOSIS — Z23 Encounter for immunization: Secondary | ICD-10-CM | POA: Diagnosis not present

## 2024-11-08 DIAGNOSIS — E119 Type 2 diabetes mellitus without complications: Secondary | ICD-10-CM | POA: Diagnosis not present

## 2024-11-08 LAB — POCT UA - MICROALBUMIN
Albumin/Creatinine Ratio, Urine, POC: <30
Creatinine, POC: 50 mg/dL
Microalbumin Ur, POC: 10 mg/L

## 2024-11-08 LAB — POCT GLYCOSYLATED HEMOGLOBIN (HGB A1C)
HbA1c, POC (controlled diabetic range): 6.5 % (ref 0.0–7.0)
Hemoglobin A1C: 6.5 % — AB (ref 4.0–5.6)

## 2024-11-08 MED ORDER — LANCET DEVICE MISC: 1.0000 | Freq: Two times a day (BID) | 0 refills | Status: AC | PRN

## 2024-11-08 MED ORDER — LANCETS MISC: 1.0000 | 11 refills | Status: AC

## 2024-11-08 MED ORDER — BLOOD GLUCOSE TEST VI STRP: 1.0000 | ORAL_STRIP | Freq: Two times a day (BID) | 11 refills | Status: AC | PRN

## 2024-11-08 MED ORDER — BLOOD GLUCOSE MONITORING SUPPL DEVI: 1.0000 | Freq: Two times a day (BID) | 0 refills | Status: AC | PRN

## 2024-11-08 NOTE — Progress Notes (Signed)
 "  Complete physical exam  Patient: Caroline Silva   DOB: Apr 11, 1956   69 y.o. Female  MRN: [id number]  Subjective:    Chief Complaint  Patient presents with   Annual Exam   Caroline Silva is a 69 y.o. female who presents today for a complete physical exam. She reports consuming a general diet. Home exercises regularly.  She generally feels well. She reports sleeping well. She does not have additional problems to discuss today.    Most recent fall risk assessment:    11/08/2024   11:24 AM  Fall Risk   Falls in the past year? 0  Number falls in past yr: 0  Injury with Fall? 0  Risk for fall due to : No Fall Risks  Follow up Falls evaluation completed     Most recent depression screenings:    11/08/2024   11:24 AM 08/10/2023    2:56 PM  PHQ 2/9 Scores  PHQ - 2 Score 0 0  PHQ- 9 Score 0     Vision:Within last year and Dental: No current dental problems and Receives regular dental care  Patient Care Team: Willo Mini, NP as PCP - General (Nurse Practitioner)   Show/hide medication list[1]  Review of Systems  Constitutional:  Negative for chills, fever, malaise/fatigue and weight loss.  HENT:  Negative for congestion, ear pain, hearing loss, sinus pain and sore throat.   Eyes:  Negative for blurred vision, photophobia and pain.  Respiratory:  Negative for cough, shortness of breath and wheezing.   Cardiovascular:  Negative for chest pain, palpitations and leg swelling.  Gastrointestinal:  Negative for abdominal pain, constipation, diarrhea, heartburn, nausea and vomiting.  Genitourinary:  Negative for dysuria, frequency and urgency.  Musculoskeletal:  Negative for falls and neck pain.  Skin:  Negative for itching and rash.  Neurological:  Negative for dizziness, weakness and headaches.  Endo/Heme/Allergies:  Negative for polydipsia. Does not bruise/bleed easily.  Psychiatric/Behavioral:  Negative for depression, substance abuse and suicidal ideas. The patient is not  nervous/anxious and does not have insomnia.      Objective:    BP (!) 95/57   Pulse (!) 59   Temp 98.2 F (36.8 C) (Oral)   Resp 20   Ht 5' 6 (1.676 m)   Wt 174 lb 0.6 oz (78.9 kg)   SpO2 99%   BMI 28.09 kg/m    Physical Exam Vitals reviewed.  Constitutional:      General: She is not in acute distress.    Appearance: Normal appearance. She is not ill-appearing.  HENT:     Head: Normocephalic and atraumatic.     Right Ear: Tympanic membrane, ear canal and external ear normal. There is no impacted cerumen.     Left Ear: Tympanic membrane, ear canal and external ear normal. There is no impacted cerumen.     Nose: Nose normal. No congestion or rhinorrhea.     Mouth/Throat:     Mouth: Mucous membranes are moist.     Pharynx: No oropharyngeal exudate or posterior oropharyngeal erythema.  Eyes:     General: No scleral icterus.       Right eye: No discharge.        Left eye: No discharge.     Extraocular Movements: Extraocular movements intact.     Conjunctiva/sclera: Conjunctivae normal.     Pupils: Pupils are equal, round, and reactive to light.  Neck:     Thyroid : No thyromegaly.     Vascular: No  carotid bruit or JVD.     Trachea: Trachea normal.  Cardiovascular:     Rate and Rhythm: Normal rate and regular rhythm.     Pulses: Normal pulses.     Heart sounds: Normal heart sounds. No murmur heard.    No friction rub. No gallop.  Pulmonary:     Effort: Pulmonary effort is normal. No respiratory distress.     Breath sounds: Normal breath sounds. No wheezing.  Abdominal:     General: Bowel sounds are normal. There is no distension.     Palpations: Abdomen is soft.     Tenderness: There is no abdominal tenderness. There is no guarding.  Musculoskeletal:        General: Normal range of motion.     Cervical back: Normal range of motion and neck supple.  Lymphadenopathy:     Cervical: No cervical adenopathy.  Skin:    General: Skin is warm and dry.  Neurological:      Mental Status: She is alert and oriented to person, place, and time.     Cranial Nerves: No cranial nerve deficit.  Psychiatric:        Mood and Affect: Mood normal.        Behavior: Behavior normal.        Thought Content: Thought content normal.        Judgment: Judgment normal.      Results for orders placed or performed in visit on 11/08/24  POCT HgB A1C  Result Value Ref Range   Hemoglobin A1C 6.5 (A) 4.0 - 5.6 %   HbA1c POC (<> result, manual entry)     HbA1c, POC (prediabetic range)     HbA1c, POC (controlled diabetic range) 6.5 0.0 - 7.0 %  POCT UA - Microalbumin  Result Value Ref Range   Microalbumin Ur, POC 10 mg/L   Creatinine, POC 50 mg/dL   Albumin/Creatinine Ratio, Urine, POC <30        Assessment & Plan:    Routine Health Maintenance and Physical Exam  Immunization History  Administered Date(s) Administered   PFIZER(Purple Top)SARS-COV-2 Vaccination 02/10/2020, 03/02/2020   PNEUMOCOCCAL CONJUGATE-20 11/08/2024   Tdap 03/27/2020    Health Maintenance  Topic Date Due   Diabetic kidney evaluation - eGFR measurement  02/13/2018   Bone Density Scan  Never done   COVID-19 Vaccine (3 - Pfizer risk series) 11/24/2024 (Originally 03/30/2020)   Influenza Vaccine  01/24/2025 (Originally 05/27/2024)   Zoster Vaccines- Shingrix (1 of 2) 02/06/2025 (Originally 07/19/1976)   Diabetic kidney evaluation - Urine ACR  11/08/2025   Mammogram  09/29/2026   Colonoscopy  03/03/2027   DTaP/Tdap/Td (2 - Td or Tdap) 03/27/2030   Pneumococcal Vaccine: 50+ Years  Completed   Hepatitis C Screening  Completed   Meningococcal B Vaccine  Aged Out    Discussed health benefits of physical activity, and encouraged her to engage in regular exercise appropriate for her age and condition.  1. Annual physical exam (Primary) Checking labs as below. UTD on preventative care. Wellness information provided with AVS. - CBC with Differential/Platelet - Comprehensive metabolic panel with GFR -  Lipid panel  2. Type 2 diabetes mellitus without complication, without long-term current use of insulin (HCC) Prior POCT hemoglobin A1c 6.3% however today's recheck is at 6.5% indicating progression to type 2 diabetes.  POCT microalbumin completed today with normal results.  Printed prescription provided for glucometer for home glucose monitoring.  Recommend her next eye exam be completed for a full  diabetic eye exam.  Updating labs today.  Discussed dietary modification and included information regarding healthy dietary habits in diabetic patients included with AVS.  Reviewed options for medication versus lifestyle/diet changes.  She would like to avoid medication at this time and plans to work on her diet.  We will plan to recheck this again in 6 months. - POCT HgB A1C - Lipid panel - POCT UA - Microalbumin  3. Postmenopausal DEXA scan ordered. - DG Bone Density; Future  4. Immunization due Prevnar 20 given in office today. - Pneumococcal conjugate vaccine 20-valent (Prevnar 20)  Return in about 6 months (around 05/08/2025) for DM follow up.   Zada Palin, NP      [1]  Outpatient Medications Prior to Visit  Medication Sig   brimonidine-timolol (COMBIGAN) 0.2-0.5 % ophthalmic solution Place 1 drop into both eyes 2 (two) times daily.   brinzolamide (AZOPT) 1 % ophthalmic suspension Place 1 drop into both eyes in the morning, at noon, and at bedtime.   fluconazole  (DIFLUCAN ) 150 MG tablet Take 1 tablet (150 mg total) by mouth daily. Repeat in 1 week if needed   latanoprost (XALATAN) 0.005 % ophthalmic solution Apply 1 drop to eye at bedtime.   [DISCONTINUED] doxycycline  (VIBRAMYCIN ) 100 MG capsule Take one cap PO Q12hr with food.   [DISCONTINUED] phenazopyridine  (PYRIDIUM ) 200 MG tablet Take 1 tablet (200 mg total) by mouth at bedtime. For urine pain   No facility-administered medications prior to visit.   "

## 2024-11-08 NOTE — Patient Instructions (Signed)
 Preventive Care 83 Years and Older, Female Preventive care refers to lifestyle choices and visits with your health care provider that can promote health and wellness. Preventive care visits are also called wellness exams. What can I expect for my preventive care visit? Counseling Your health care provider may ask you questions about your: Medical history, including: Past medical problems. Family medical history. Pregnancy and menstrual history. History of falls. Current health, including: Memory and ability to understand (cognition). Emotional well-being. Home life and relationship well-being. Sexual activity and sexual health. Lifestyle, including: Alcohol, nicotine or tobacco, and drug use. Access to firearms. Diet, exercise, and sleep habits. Work and work Astronomer. Sunscreen use. Safety issues such as seatbelt and bike helmet use. Physical exam Your health care provider will check your: Height and weight. These may be used to calculate your BMI (body mass index). BMI is a measurement that tells if you are at a healthy weight. Waist circumference. This measures the distance around your waistline. This measurement also tells if you are at a healthy weight and may help predict your risk of certain diseases, such as type 2 diabetes and high blood pressure. Heart rate and blood pressure. Body temperature. Skin for abnormal spots. What immunizations do I need?  Vaccines are usually given at various ages, according to a schedule. Your health care provider will recommend vaccines for you based on your age, medical history, and lifestyle or other factors, such as travel or where you work. What tests do I need? Screening Your health care provider may recommend screening tests for certain conditions. This may include: Lipid and cholesterol levels. Hepatitis C test. Hepatitis B test. HIV (human immunodeficiency virus) test. STI (sexually transmitted infection) testing, if you are at  risk. Lung cancer screening. Colorectal cancer screening. Diabetes screening. This is done by checking your blood sugar (glucose) after you have not eaten for a while (fasting). Mammogram. Talk with your health care provider about how often you should have regular mammograms. BRCA-related cancer screening. This may be done if you have a family history of breast, ovarian, tubal, or peritoneal cancers. Bone density scan. This is done to screen for osteoporosis. Talk with your health care provider about your test results, treatment options, and if necessary, the need for more tests. Follow these instructions at home: Eating and drinking  Eat a diet that includes fresh fruits and vegetables, whole grains, lean protein, and low-fat dairy products. Limit your intake of foods with high amounts of sugar, saturated fats, and salt. Take vitamin and mineral supplements as recommended by your health care provider. Do not drink alcohol if your health care provider tells you not to drink. If you drink alcohol: Limit how much you have to 0-1 drink a day. Know how much alcohol is in your drink. In the [place] one drink equals one 12 oz bottle of beer (355 mL), one 5 oz glass of wine (148 mL), or one 1 oz glass of hard liquor (44 mL). Lifestyle Brush your teeth every morning and night with fluoride toothpaste. Floss one time each day. Exercise for at least 30 minutes 5 or more days each week. Do not use any products that contain nicotine or tobacco. These products include cigarettes, chewing tobacco, and vaping devices, such as e-cigarettes. If you need help quitting, ask your health care provider. Do not use drugs. If you are sexually active, practice safe sex. Use a condom or other form of protection in order to prevent STIs. Take aspirin only as told by  your health care provider. Make sure that you understand how much to take and what form to take. Work with your health care provider to find out whether it  is safe and beneficial for you to take aspirin daily. Ask your health care provider if you need to take a cholesterol-lowering medicine (statin). Find healthy ways to manage stress, such as: Meditation, yoga, or listening to music. Journaling. Talking to a trusted person. Spending time with friends and family. Minimize exposure to UV radiation to reduce your risk of skin cancer. Safety Always wear your seat belt while driving or riding in a vehicle. Do not drive: If you have been drinking alcohol. Do not ride with someone who has been drinking. When you are tired or distracted. While texting. If you have been using any mind-altering substances or drugs. Wear a helmet and other protective equipment during sports activities. If you have firearms in your house, make sure you follow all gun safety procedures. What's next? Visit your health care provider once a year for an annual wellness visit. Ask your health care provider how often you should have your eyes and teeth checked. Stay up to date on all vaccines. This information is not intended to replace advice given to you by your health care provider. Make sure you discuss any questions you have with your health care provider. Document Revised: 04/10/2021 Document Reviewed: 04/10/2021 Elsevier Patient Education  2024 [employer].

## 2024-11-09 ENCOUNTER — Ambulatory Visit: Payer: Self-pay | Admitting: Medical-Surgical

## 2024-11-09 LAB — COMPREHENSIVE METABOLIC PANEL WITH GFR
ALT: 13 IU/L (ref 0–32)
AST: 28 IU/L (ref 0–40)
Albumin: 4.5 g/dL (ref 3.9–4.9)
Alkaline Phosphatase: 96 IU/L (ref 49–135)
BUN/Creatinine Ratio: 19 (ref 12–28)
BUN: 15 mg/dL (ref 8–27)
Bilirubin Total: 0.4 mg/dL (ref 0.0–1.2)
CO2: 19 mmol/L — ABNORMAL LOW (ref 20–29)
Calcium: 9.5 mg/dL (ref 8.7–10.3)
Chloride: 103 mmol/L (ref 96–106)
Creatinine, Ser: 0.77 mg/dL (ref 0.57–1.00)
Globulin, Total: 3 g/dL (ref 1.5–4.5)
Glucose: 94 mg/dL (ref 70–99)
Potassium: 4.6 mmol/L (ref 3.5–5.2)
Sodium: 139 mmol/L (ref 134–144)
Total Protein: 7.5 g/dL (ref 6.0–8.5)
eGFR: 84 mL/min/1.73

## 2024-11-09 LAB — CBC WITH DIFFERENTIAL/PLATELET
Basophils Absolute: 0 x10E3/uL (ref 0.0–0.2)
Basos: 1 %
EOS (ABSOLUTE): 0.1 x10E3/uL (ref 0.0–0.4)
Eos: 2 %
Hematocrit: 38.3 % (ref 34.0–46.6)
Hemoglobin: 12.7 g/dL (ref 11.1–15.9)
Immature Grans (Abs): 0 x10E3/uL (ref 0.0–0.1)
Immature Granulocytes: 0 %
Lymphocytes Absolute: 1.5 x10E3/uL (ref 0.7–3.1)
Lymphs: 28 %
MCH: 31.9 pg (ref 26.6–33.0)
MCHC: 33.2 g/dL (ref 31.5–35.7)
MCV: 96 fL (ref 79–97)
Monocytes Absolute: 0.6 x10E3/uL (ref 0.1–0.9)
Monocytes: 12 %
Neutrophils Absolute: 3.2 x10E3/uL (ref 1.4–7.0)
Neutrophils: 57 %
Platelets: 129 x10E3/uL — ABNORMAL LOW (ref 150–450)
RBC: 3.98 x10E6/uL (ref 3.77–5.28)
RDW: 11.8 % (ref 11.7–15.4)
WBC: 5.5 x10E3/uL (ref 3.4–10.8)

## 2024-11-09 LAB — LIPID PANEL
Chol/HDL Ratio: 5.9 ratio — ABNORMAL HIGH (ref 0.0–4.4)
Cholesterol, Total: 219 mg/dL — ABNORMAL HIGH (ref 100–199)
HDL: 37 mg/dL — ABNORMAL LOW
LDL Chol Calc (NIH): 149 mg/dL — ABNORMAL HIGH (ref 0–99)
Triglycerides: 182 mg/dL — ABNORMAL HIGH (ref 0–149)
VLDL Cholesterol Cal: 33 mg/dL (ref 5–40)
